# Patient Record
Sex: Female | Born: 1975 | Race: White | Hispanic: No | Marital: Married | Smoking: Former smoker
Health system: Southern US, Community
[De-identification: ages and names within clinical notes are randomized; demographics above are authoritative.]

## PROBLEM LIST (undated history)

## (undated) DIAGNOSIS — F419 Anxiety disorder, unspecified: Secondary | ICD-10-CM

## (undated) DIAGNOSIS — F32A Depression, unspecified: Secondary | ICD-10-CM

## (undated) DIAGNOSIS — B001 Herpesviral vesicular dermatitis: Secondary | ICD-10-CM

## (undated) HISTORY — DX: Anxiety disorder, unspecified: F41.9

## (undated) HISTORY — PX: PLACEMENT OF BREAST IMPLANTS: SHX6334

## (undated) HISTORY — DX: Herpesviral vesicular dermatitis: B00.1

## (undated) HISTORY — DX: Depression, unspecified: F32.A

---

## 2002-09-10 ENCOUNTER — Other Ambulatory Visit: Admission: RE | Admit: 2002-09-10 | Discharge: 2002-09-10 | Payer: Self-pay | Admitting: Obstetrics and Gynecology

## 2003-11-09 ENCOUNTER — Other Ambulatory Visit: Admission: RE | Admit: 2003-11-09 | Discharge: 2003-11-09 | Payer: Self-pay | Admitting: Obstetrics and Gynecology

## 2004-09-15 ENCOUNTER — Other Ambulatory Visit: Admission: RE | Admit: 2004-09-15 | Discharge: 2004-09-15 | Payer: Self-pay | Admitting: Obstetrics and Gynecology

## 2005-01-05 ENCOUNTER — Ambulatory Visit (HOSPITAL_COMMUNITY): Admission: RE | Admit: 2005-01-05 | Discharge: 2005-01-05 | Payer: Self-pay | Admitting: Obstetrics and Gynecology

## 2005-03-26 ENCOUNTER — Inpatient Hospital Stay (HOSPITAL_COMMUNITY): Admission: AD | Admit: 2005-03-26 | Discharge: 2005-03-30 | Payer: Self-pay | Admitting: Obstetrics and Gynecology

## 2005-04-23 ENCOUNTER — Other Ambulatory Visit: Admission: RE | Admit: 2005-04-23 | Discharge: 2005-04-23 | Payer: Self-pay | Admitting: Obstetrics and Gynecology

## 2006-10-11 ENCOUNTER — Ambulatory Visit (HOSPITAL_COMMUNITY): Admission: RE | Admit: 2006-10-11 | Discharge: 2006-10-11 | Payer: Self-pay | Admitting: Obstetrics and Gynecology

## 2007-01-01 ENCOUNTER — Ambulatory Visit (HOSPITAL_COMMUNITY): Admission: RE | Admit: 2007-01-01 | Discharge: 2007-01-01 | Payer: Self-pay | Admitting: Obstetrics and Gynecology

## 2007-03-19 ENCOUNTER — Inpatient Hospital Stay (HOSPITAL_COMMUNITY): Admission: AD | Admit: 2007-03-19 | Discharge: 2007-03-22 | Payer: Self-pay | Admitting: Obstetrics and Gynecology

## 2007-09-30 ENCOUNTER — Ambulatory Visit: Admission: RE | Admit: 2007-09-30 | Discharge: 2007-09-30 | Payer: Self-pay | Admitting: Obstetrics and Gynecology

## 2010-09-08 ENCOUNTER — Emergency Department (HOSPITAL_COMMUNITY)
Admission: EM | Admit: 2010-09-08 | Discharge: 2010-09-08 | Disposition: A | Discharge status: Other Acute Inpatient Hospital | Payer: 59 | Attending: Emergency Medicine | Admitting: Emergency Medicine

## 2010-09-08 ENCOUNTER — Inpatient Hospital Stay (HOSPITAL_COMMUNITY)
Admission: AD | Admit: 2010-09-08 | Discharge: 2010-09-09 | DRG: 885 | Disposition: A | Discharge status: Home / Self Care | Payer: 59 | Source: Ambulatory Visit | Attending: Psychiatry | Admitting: Psychiatry

## 2010-09-08 DIAGNOSIS — F329 Major depressive disorder, single episode, unspecified: Principal | ICD-10-CM

## 2010-09-08 DIAGNOSIS — R45851 Suicidal ideations: Secondary | ICD-10-CM | POA: Insufficient documentation

## 2010-09-08 DIAGNOSIS — F17200 Nicotine dependence, unspecified, uncomplicated: Secondary | ICD-10-CM

## 2010-09-08 DIAGNOSIS — Z79899 Other long term (current) drug therapy: Secondary | ICD-10-CM | POA: Insufficient documentation

## 2010-09-08 DIAGNOSIS — Z818 Family history of other mental and behavioral disorders: Secondary | ICD-10-CM

## 2010-09-08 DIAGNOSIS — F341 Dysthymic disorder: Secondary | ICD-10-CM | POA: Insufficient documentation

## 2010-09-08 LAB — URINALYSIS, ROUTINE W REFLEX MICROSCOPIC
Glucose, UA: NEGATIVE mg/dL
Hgb urine dipstick: NEGATIVE
Nitrite: NEGATIVE
Protein, ur: NEGATIVE mg/dL
Specific Gravity, Urine: 1.018 (ref 1.005–1.030)

## 2010-09-08 LAB — DIFFERENTIAL
Basophils Relative: 1 % (ref 0–1)
Eosinophils Relative: 1 % (ref 0–5)
Lymphocytes Relative: 27 % (ref 12–46)
Lymphs Abs: 1.8 10*3/uL (ref 0.7–4.0)
Monocytes Absolute: 0.5 10*3/uL (ref 0.1–1.0)
Monocytes Relative: 7 % (ref 3–12)
Neutro Abs: 4.2 10*3/uL (ref 1.7–7.7)

## 2010-09-08 LAB — CBC
Hemoglobin: 13.5 g/dL (ref 12.0–15.0)
Platelets: 325 10*3/uL (ref 150–400)
RBC: 5.1 MIL/uL (ref 3.87–5.11)
RDW: 13.4 % (ref 11.5–15.5)
WBC: 6.6 10*3/uL (ref 4.0–10.5)

## 2010-09-08 LAB — ETHANOL: Alcohol, Ethyl (B): <5 mg/dL (ref 0–10)

## 2010-09-08 LAB — POCT I-STAT, CHEM 8
Hemoglobin: 14.6 g/dL (ref 12.0–15.0)
Sodium: 139 mEq/L (ref 135–145)
TCO2: 29 mmol/L (ref 0–100)

## 2010-09-09 DIAGNOSIS — F4320 Adjustment disorder, unspecified: Secondary | ICD-10-CM

## 2010-09-26 NOTE — H&P (Signed)
[name], FINKLE NO.:  [id number]  MEDICAL RECORD NO.:  [id number]           PATIENT TYPE:  I  LOCATION:  0505                          FACILITY:  [facility]  PHYSICIAN:  Eulogio Ditch, MD DATE OF BIRTH:  13-Feb-1976  DATE OF ADMISSION:  09/08/2010 DATE OF DISCHARGE:                      PSYCHIATRIC ADMISSION ASSESSMENT   She presented with her husband to the [facility] emergency department. She stated that she was suicidal, that she had seen her primary care physician, that she had been taking Wellbutrin for depression and Klonopin for anxiety for 2 days, and that the suicidal ideation had started several weeks ago.  Today, she amends that.  She says that she was not actually suicidal she was just hopeless and she just did not think anything would "make her happy."  Apparently she has been having an affair.  Her husband found out about this and this led to her going to her primary care to get any antidepressant.  She does acknowledge anxiety, depression, feeling hopeless, feeling sad, suicidal ideation, suicidal thoughts and being tearful.  She has also recently been using some alcohol.  She denies auditory or visual hallucinations.  PAST PSYCHIATRIC HISTORY:  She says 7 or 8 years ago she had some therapy and some medication back at that time.  She was having relationship issues with the man who is now her husband.  SOCIAL HISTORY:  She received her BA in 1999.  She has been married almost 6 years.  She has 2 sons, ages 76 and 80.  She is a stay-at-home mom.  FAMILY HISTORY:  She states her mother had to be committed for depression.  She does have occasional cigarettes and alcohol.  Her primary care provider is Dr. Gerda Diss.  She has been seeing the therapist, a Dr. Eulah Pont.  MEDICAL PROBLEMS:  She denies.  MEDICATIONS:  She states she was just started on Wellbutrin SA 150 mg per day 2 days ago.  Klonopin 0.5 mg p.o. t.i.d.  DRUG ALLERGIES:  No known  drug allergies.  POSITIVE PHYSICAL FINDINGS:  A well-developed, well-nourished white female who appears her stated age of 37.  She is petite.  Vital signs were stable.  Temperature was 98.2, blood pressure was 91/54, pulse 92, respirations 16.  Her urine pregnancy test was negative.  She had no abnormalities of her CBC or UA.  No drugs in her UDS and alcohol level was less than 5.  MENTAL STATUS EXAM:  She was seen earlier by Dr. Electa Sniff.  She was noted to be alert and oriented, pleasant but sad.  Denies active SI, but acknowledges sincerity of crisis and wants help.  She is overwhelmed. They agree to continue her with the Wellbutrin.  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 Axis II:  Deferred. Axis III:  None known. Axis IV:  Marital and family issues. Axis V:  20.  PLAN:  The plan is to admit for safety and stabilization.  We will continue the Wellbutrin 150 mg p.o. daily as well as the Klonopin 0.5 mg t.i.d., and set up a family session with her husband and consider  IOP. Estimated length of stay is 1-2 days.     Mickie Leonarda Salon, P.A.-C.   ______________________________ Eulogio Ditch, MD    MD/MEDQ  D:  09/09/2010  T:  09/09/2010  Job:  [id number]  Electronically Signed by Jaci Lazier ADAMS P.A.-C. on 09/23/2010 03:32:21 PM Electronically Signed by Eulogio Ditch  on 09/26/2010 05:49:16 PM

## 2010-10-10 NOTE — Op Note (Signed)
[name], [name]              ACCOUNT NO.:  [id number]   MEDICAL RECORD NO.:  [id number]          PATIENT TYPE:  INP   LOCATION:  9102                          [facility]ACILITY:  [facility]   PHYSICIAN:  Juluis Mire, M.D.   DATE OF BIRTH:  06-13-1975   DATE OF PROCEDURE:  03/19/2007  DATE OF DISCHARGE:                               OPERATIVE REPORT   PREOPERATIVE DIAGNOSIS:  Intrauterine pregnancy at 38 weeks.  Spontaneous onset of labor after a fall.  Prior cesarean section desires  repeat.   POSTOPERATIVE DIAGNOSIS:  Intrauterine pregnancy at 38 weeks.  Spontaneous onset of labor after a fall.  Prior cesarean section desires  repeat.   OPERATIVE PROCEDURE:  Repeat low transverse cesarean section.   SURGEON:  Juluis Mire, M.D.   ANESTHESIA:  Spinal.   ESTIMATED BLOOD LOSS:  700 to 800 mL.   PACKS AND DRAINS:  None.   INTRAOPERATIVE BLOOD REPLACED:  None.   COMPLICATIONS:  None.   INDICATIONS:  The patient is a 35 year old gravida 2, para 1 female  prior cesarean section desires repeat is 38 weeks the present time.  Fell on her buttocks at home.  Was subsequently seen in the office sent  to triage for further evaluation.  Ultrasound, blood work and Chief Operating Officer were all negative.  However, she was having regular uterine  activity, fetal heart rate was reactive with no decels but did have  progressive cervical changes.  In view of this, will proceed with repeat  cesarean section.  The risks were discussed, including the risk of  infection.  Risk of hemorrhage could require transfusion with risk of  AIDS or hepatitis.  Risk of injury to adjacent organs including bladder,  bowel, ureters could require further exploratory surgery.  Risk of deep  venous thrombosis and pulmonary embolus.  Procedure was follows.   The patient taken to OR, placed supine position with left lateral tilt.  After satisfactory level spinal anesthesia obtained, the  abdomen was  prepped out Betadine  and draped sterile field.  Prior low transverse  skin incision was excised and excision was extended through subcutaneous  tissue.  Fascia was entered sharp and incision fascia extended  laterally.  Fascia taken off the muscle superiorly inferiorly.  Rectus  muscles separated midline.  Perineum was entered sharply, incision  perineum extended both superiorly inferiorly.  Low transverse bladder  flap was developed a low transverse uterine incision begun with a knife  and extended laterally using manual traction. Infant presented vertex  presentation, delivered elevation head and fundal pressure.  Infant was  a viable female who weighed 7 pounds 4 ounces, Apgars were 9/9, umbilical  artery pH was 7.32.  There was a nuchal cord x1.  Placenta was delivered  manually, noted be unremarkable.  Uterus was exteriorized for closure.  Uterus, tubes and ovaries were unremarkable.  Uterus closed with running  locked suture 0 chromic using a two-layer closure technique.  We had  good hemostasis.  The uterus was turned abdominal cavity.  We irrigated  the pelvis had good hemostasis and clear urine output.  Muscles and  peritoneum closed running suture of 3-0 Vicryl.  Fascia closed with  running suture of 0 PDS.  Skin was closed with staples Steri-Strips.  Sponges, instrument needle count reported as correct by circulating  nurse x2.  Foley catheter remained clear at time of closure.  The  patient tolerated procedure well returned to recovery room in good  condition.      Juluis Mire, M.D.  Electronically Signed     JSM/MEDQ  D:  03/19/2007  T:  03/20/2007  Job:  [id number]

## 2010-10-13 NOTE — Op Note (Signed)
[name], [name]              ACCOUNT NO.:  [id number]   MEDICAL RECORD NO.:  [id number]          PATIENT TYPE:  INP   LOCATION:  9129                          [facility]ILITY[facility]   PHYSICIAN:  Guy Sandifer. Henderson Cloud, M.D. DATE OF BIRTH:  30-Jun-1975   DATE OF PROCEDURE:  03/27/2005  DATE OF DISCHARGE:                                 OPERATIVE REPORT   PREOPERATIVE DIAGNOSES:  1.  Intrauterine pregnancy at 39-1/7 weeks.  2.  Arrest of cervical dilation.   POSTOPERATIVE DIAGNOSES:  1.  Intrauterine pregnancy at 39-1/7 weeks.  2.  Arrest of cervical dilation.   PROCEDURE:  Low transverse cesarean section.   SURGEON:  Guy Sandifer. Henderson Cloud, M.D.   ANESTHESIA:  Epidural, Angelica Pou, M.D.   ESTIMATED BLOOD LOSS:  1000 mL.   FINDINGS:  A viable female infant, Apgars of 9 and 9 at one and five minutes,  respectively.  Arterial cord pH 7.33, birth weight pending.   INDICATIONS AND CONSENT:  This patient is a 35 year old married white  female, G1, P0, with an EDC of April 02, 2005, with uncomplicated prenatal  care and negative group B beta strep culture.  She presents in labor with  the cervix 2 cm dilated, 90% effaced, -1 station.  Artificial rupture of  membranes is carried out.  The patient progresses to 4 cm dilated, complete  effacement, -1 station.  Internal uterine pressure catheter is then placed.  Pitocin is running.  Epidural is in place.  After several hours of adequate  labor with [place] units in the 210-230 range, the cervix is without  change.  A diagnosis of arrest of cervical dilation is made.  A cesarean  section is recommended.  The potential risks and complications are discussed  preoperatively with the patient and her husband, including but not limited  to infection, bowel, bladder or ureteral damage, bleeding requiring of  transfusion of blood products and possible transfusion reaction, HIV and  hepatitis acquisition, DVT, PE and pneumonia.  All questions are  answered  and consent is signed on the chart.   PROCEDURE:  The patient is taken to the operating room, where she is  identified, epidural anesthetic is augmented to a surgical level, and she is  placed on the operating room table in the dorsal supine position with a 15  degree left lateral wedge.  A Foley catheter is already in place.  She is  prepped and draped in a sterile fashion.  After testing for adequate  epidural anesthesia, skin is entered through a Pfannenstiel incision and  dissection is carried out in layers to the peritoneum.  The peritoneum is  incised and extended superiorly and inferiorly.  The vesicouterine  peritoneum is taken down cephalolaterally, the bladder flap is developed and  the bladder blade is placed.  The uterus is then incised in a low transverse  fashion and the uterine cavity is entered bluntly with a hemostat.  The  uterine incision is then extended cephalolaterally with the fingers.  The  vertex is delivered, nuchal cord x1 is reduced.  Oropharynx and nasopharynx  were suctioned.  The remainder of the baby is delivered.  Good cry and tone  is noted.  The cord is clamped and cut and the baby is handed to the waiting  pediatrics team.  The placenta is manually delivered.  The uterus is clean.  The uterus is closed in two running, locking, imbricating layers of 0  Monocryl suture, which achieves good hemostasis.  Tubes and ovaries are  normal.  Anterior peritoneum is closed in a running fashion with 0 Monocryl  suture, which is also used to reapproximate the pyramidalis muscle in the  midline.  Anterior rectus fascia is closed in a running fashion with 0 PDS  suture and the skin is closed with clips.  All sponge, instrument and needle  counts are correct and the patient is transferred to the recovery room in  stable condition.      Guy Sandifer Henderson Cloud, M.D.  Electronically Signed     JET/MEDQ  D:  03/27/2005  T:  03/27/2005  Job:  [id number]

## 2010-10-13 NOTE — Discharge Summary (Signed)
NAMETRACHELLE, [name]              ACCOUNT NO.:  [id number]   MEDICAL RECORD NO.:  [id number]          PATIENT TYPE:  INP   LOCATION:  9102                          [facility]   PHYSICIAN:  Dineen Kid. Rana Snare, M.D.    DATE OF BIRTH:  22-Jan-1976   DATE OF ADMISSION:  03/19/2007  DATE OF DISCHARGE:  03/22/2007                               DISCHARGE SUMMARY   ADMITTING DIAGNOSIS:  1. Intrauterine pregnancy at 38 weeks estimated gestational age  35. Onset of labor after sustaining a fall  3. Previous cesarean section desires repeat   DISCHARGE DIAGNOSIS:  1. Status post [name] transverse cesarean section  2. A viable female infant.   PROCEDURE:  Repeat [name] transverse cesarean section.   REASON FOR ADMISSION:  Please see written H&P.   HOSPITAL COURSE:  The patient is a 35 year old gravida 2, para 1 that  was admitted to [facility] at 38 weeks estimated  gestational age.  The patient had sustained a fall in which she had  landed on her buttocks and was now admitted to maternity admissions for  observation.  The patient was noted to have some continuous  contractions.  Fetal heart tones were reactive without deceleration.  Ultrasound was performed which revealed placenta intact.  Biophysical  profile 8/8.  Kleihauer Becky was negative.  CBC was normal.  The  patient continued to have contractions with cervical change.  The  patient was now admitted for a repeat cesarean delivery.  The patient  was then transferred to the operating room where spinal anesthesia was  administered without difficulty.  [name] transverse incision was made with  delivery of a viable female infant weighing 7 pounds 4 ounces, Apgars of 9  at 1 minute and 9 at 5 minutes.  The patient tolerated procedure well  and taken to the recovery room in stable condition.  On postoperative  day #1 the patient was without complaint.  Vital signs were stable.  Abdomen soft with good return of bowel function.  Fundus is  firm and  nontender.  Abdominal dressing was noted to have a scant amount of old  drainage noted on the bandage.  Foley had been discontinued and the  patient was voiding well.  Laboratory findings revealed hemoglobin of  10.2, platelet count 175,000, WBC count of 11.4.  The patient was known  to be Rh negative and studies were pending for baby blood type.  On  postoperative day #2 the patient was without complaint.  Vital signs  were stable.  Abdomen soft.  Fundus firm and nontender.  Abdominal  dressing had been removed revealing an incision that is clean, dry and  intact.  Baby was known to be Rh positive and RhoGAM was administered to  the patient.  On postoperative day #3 the patient was without complaint.  Vital signs remained stable.  She was afebrile.  Fundus firm and  nontender.  Incision was clean, dry and intact.  Staples removed and the  patient was later discharged home.   CONDITION ON DISCHARGE:  Stable.   DIET:  Regular as tolerated.  ACTIVITY:  No heavy lifting, no driving x2 weeks, no vaginal entry.   FOLLOW UP:  Patient to follow up in the office in 1-2 weeks for incision  check.  She is to call for temperature greater than 100 degrees,  persistent nausea, vomiting, heavy vaginal bleeding and/or redness or  drainage from incisional site.   DISCHARGE MEDICATIONS:  Darvocet N 100 numbers 30 1 p.o. every 4-6 hours  p.r.n. .  Motrin 600 mg every 6 hours.  Prenatal vitamins one p.o.  daily, Colace 1 p.o. daily p.r.n.      Julio Sicks, N.P.      Dineen Kid Rana Snare, M.D.  Electronically Signed    CC/MEDQ  D:  04/10/2007  T:  04/10/2007  Job:  [id number]

## 2010-10-13 NOTE — Discharge Summary (Signed)
[name], [name]              ACCOUNT NO.:  [id number]   MEDICAL RECORD NO.:  [id number]          PATIENT TYPE:  INP   LOCATION:  9129                          [facility]   PHYSICIAN:  Juluis Mire, M.D.   DATE OF BIRTH:  04/30/1976   DATE OF ADMISSION:  03/26/2005  DATE OF DISCHARGE:  03/30/2005                                 DISCHARGE SUMMARY   ADMITTING DIAGNOSES:  1.  Intrauterine pregnancy at 39-1/7th's weeks' estimated gestational age.  2.  Spontaneous onset of labor.   DISCHARGE DIAGNOSES:  1.  Status post low transverse cesarean section secondary to failure to      progress.  2.  Viable female infant.   PROCEDURE:  Primary low transverse cesarean section.   REASON FOR ADMISSION:  Please see written H&P.   HOSPITAL COURSE:  The patient was a 35 year old, white, married female  primigravida that was admitted to [facility] with  spontaneous onset of labor.  On admission, the patient was noted to be 2-cm  dilated, 90% effaced, vertex at -1 station.  Artificial rupture of membranes  was performed revealing clear fluid.  The patient did progress to 4-cm  dilated, completely effaced at -1 station.  Internal pressure catheter was  placed and Pitocin was administered to augment her labor.  Epidural was  placed for the patient's comfort.  After several hours of adequate labor,  cervix was without change.  Decision was made to proceed with a cesarean  delivery. The patient was then transferred to the operating room where  epidural was dosed to an adequate surgical level.  A low transverse incision  was made with the delivery of a viable female infant weighing 6 pounds 13  ounces, with Apgar's of 9 and 1 minute and 9 at 5 minutes.  Arterial cord pH  of 7.33.  The patient tolerated procedure well and was taken to the recovery  room in stable condition.  On postoperative day #1, the patient was without complaint.  Vital signs  were stable.  She was afebrile.   Abdomen soft.  The fundus firm and  nontender.  Incision was clean, dry, and intact.  Some small ecchymosis was  noted inferior to the incisional site.  Laboratory findings revealed  hemoglobin of 9.8, platelet count 178,000, WBC count of 12.6.  Postoperative day #2, the patient was without complaint.  Vital signs  remained stable.  Abdomen soft with good return of bowel function.  Fundus  is firm and nontender.  Incision was clean, dry, and intact.  Small amount  of ecchymosis continued.  The patient was ambulating well, tolerating a  regular diet without complaints of nausea and vomiting.  Postoperative day #3, the patient was without complaint.  Vital signs were  stable.  Fundus firm and nontender.  Incision was clean, dry, and intact.  Staples removed.  The patient was discharged to home.   CONDITION ON DISCHARGE:  Good.   DIET:  Regular as tolerated.   ACTIVITY:  No heavy lifting, no driving x2 weeks, no vaginal entry.   FOLLOW UP:  Patient to  follow up in the office in 1-2 weeks for incision  check.   She is to call for temperature greater than 100 degrees, persistent nausea  and vomiting, heavy vaginal bleeding, and/or redness or drainage from the  incisional site.   DISCHARGE MEDICATIONS:  1.  Tylox, #30, one p.o. every four to six hours p.r.n.  2.  Motrin 600 mg every six hours p.r.n.  3.  Prenatal vitamins one p.o. daily.  4.  Colace one p.o. daily p.r.n.      Julio Sicks, N.P.      Juluis Mire, M.D.  Electronically Signed    CC/MEDQ  D:  05/11/2005  T:  05/12/2005  Job:  [id number]

## 2011-03-07 LAB — CBC
Hemoglobin: 12.8
MCHC: 34.2
Platelets: 210
RDW: 13.6
RDW: 13.8
WBC: 11.4 — ABNORMAL HIGH

## 2011-03-07 LAB — RH IMMUNE GLOB WKUP(>/=20WKS)(NOT WOMEN'S HOSP): Fetal Screen: NEGATIVE

## 2011-03-07 LAB — KLEIHAUER-BETKE STAIN: Quantitation Fetal Hemoglobin: 0

## 2011-03-07 LAB — RPR: RPR Ser Ql: NONREACTIVE

## 2011-03-12 LAB — RH IMMUNE GLOBULIN WORKUP (NOT WOMEN'S HOSP)
ABO/RH(D): O NEG
Antibody Screen: NEGATIVE

## 2012-08-21 ENCOUNTER — Encounter: Payer: Self-pay | Admitting: Family Medicine

## 2012-08-21 ENCOUNTER — Ambulatory Visit (INDEPENDENT_AMBULATORY_CARE_PROVIDER_SITE_OTHER): Payer: BC Managed Care – PPO | Admitting: Family Medicine

## 2012-08-21 VITALS — BP 114/74 | Temp 98.6°F | Ht 63.0 in | Wt 118.8 lb

## 2012-08-21 DIAGNOSIS — E162 Hypoglycemia, unspecified: Secondary | ICD-10-CM

## 2012-08-21 DIAGNOSIS — F329 Major depressive disorder, single episode, unspecified: Secondary | ICD-10-CM

## 2012-08-21 DIAGNOSIS — J322 Chronic ethmoidal sinusitis: Secondary | ICD-10-CM

## 2012-08-21 DIAGNOSIS — F3289 Other specified depressive episodes: Secondary | ICD-10-CM

## 2012-08-21 DIAGNOSIS — F32A Depression, unspecified: Secondary | ICD-10-CM

## 2012-08-21 DIAGNOSIS — Z0000 Encounter for general adult medical examination without abnormal findings: Secondary | ICD-10-CM

## 2012-08-21 LAB — GLUCOSE, POCT (MANUAL RESULT ENTRY): POC Glucose: 121 mg/dl — AB (ref 70–99)

## 2012-08-21 MED ORDER — CLARITHROMYCIN 500 MG PO TABS: 500.0000 mg | ORAL_TABLET | Freq: Two times a day (BID) | ORAL | Status: AC

## 2012-08-21 NOTE — Progress Notes (Signed)
  Subjective:    Patient ID: [name], female    DOB: April 24, 1976, 37 y.o.   MRN: [id number]  Sinusitis The current episode started more than 1 month ago. The problem has been gradually worsening since onset. There has been no fever. Her pain is at a severity of 3/10. The pain is mild. Associated symptoms include headaches (frontal and max) and a hoarse voice (five days). Past treatments include nothing. The treatment provided mild relief.  Diabetes Hypoglycemia symptoms include headaches (frontal and max). Associated symptoms include fatigue.    Dizziness with eating. Light out   Review of Systems  Constitutional: Positive for fatigue.  HENT: Positive for hoarse voice (five days).   Neurological: Positive for headaches (frontal and max).  All other systems reviewed and are negative.   Results for orders placed in visit on 08/21/12  GLUCOSE, POCT (MANUAL RESULT ENTRY)      Result Value Range   POC Glucose 121 (*) 70 - 99 mg/dl       Objective:   Physical Exam  Alert hydration good. Vitals reviewed. Frontal tenderness evident. TMs normal. Pharynx normal. Neck supple no adenopathy lungs clear. Heart regular in rhythm.      Assessment & Plan:  Impression 1 sinusitis. No evidence of diabetes discussed. Plan as per orders. Symptomatic care discussed.

## 2012-08-28 ENCOUNTER — Telehealth: Payer: Self-pay | Admitting: Family Medicine

## 2012-08-28 NOTE — Telephone Encounter (Signed)
Hycodan 4 ounces 1 teaspoon each bedtime when necessary for cough

## 2012-08-28 NOTE — Telephone Encounter (Signed)
Rx called into [facility] per doctors orders.

## 2012-08-28 NOTE — Telephone Encounter (Signed)
Patient would like a cough suppressant called into [facility] in [place].

## 2012-10-21 ENCOUNTER — Telehealth: Payer: Self-pay | Admitting: Family Medicine

## 2012-10-21 MED ORDER — VALACYCLOVIR HCL 1 G PO TABS: ORAL_TABLET | ORAL | Status: DC

## 2012-10-21 NOTE — Telephone Encounter (Signed)
Patient would like Valtrex called in for her cold sores to [facility] in [place]

## 2012-10-21 NOTE — Telephone Encounter (Signed)
Valtrex 1 g. Two tabs , followed 12 hrs later by two more tabs prn flare fever blister. Numb 12. Ref 3

## 2012-10-21 NOTE — Telephone Encounter (Signed)
RX sent in to [facility]. Via [facility]cribe. TCNA @ 6:20 pm.

## 2013-12-30 ENCOUNTER — Ambulatory Visit (INDEPENDENT_AMBULATORY_CARE_PROVIDER_SITE_OTHER): Payer: 59 | Admitting: Nurse Practitioner

## 2013-12-30 ENCOUNTER — Encounter: Payer: Self-pay | Admitting: Nurse Practitioner

## 2013-12-30 VITALS — BP 110/70 | Temp 98.3°F | Ht 63.0 in | Wt 122.0 lb

## 2013-12-30 DIAGNOSIS — J029 Acute pharyngitis, unspecified: Secondary | ICD-10-CM

## 2013-12-30 LAB — POCT RAPID STREP A (OFFICE): RAPID STREP A SCREEN: NEGATIVE

## 2013-12-31 LAB — STREP A DNA PROBE: GASP: NEGATIVE

## 2014-01-04 ENCOUNTER — Encounter: Payer: Self-pay | Admitting: Nurse Practitioner

## 2014-01-04 NOTE — Progress Notes (Signed)
Subjective:  Presents for complaints of sore throat body aches low-grade fever and ear pain that began 2 days ago. No headache. No cough runny nose or wheezing. No vomiting diarrhea or abdominal pain. No rash. Taking fluids well. Voiding normal limit. Possible blisters noted in the throat.  Objective:   BP 110/70  Temp(Src) 98.3 F (36.8 C)  Ht 5\' 3"  (1.6 m)  Wt 122 lb (55.339 kg)  BMI 21.62 kg/m2 NAD. Alert, oriented. TMs mild clear effusion, no erythema. Pharynx mild erythema, no lesions noted. RST negative. No oral lesions. Neck supple with mild soft anterior adenopathy. Lungs clear. Heart regular rate rhythm. Skin clear.  Assessment: Acute pharyngitis, unspecified pharyngitis type - Plan: Strep A DNA probe, POCT rapid strep A  Plan: Throat culture pending. Reviewed symptomatic care and warning signs. Recheck if symptoms worsen or persist.

## 2014-01-14 ENCOUNTER — Encounter: Payer: Self-pay | Admitting: Nurse Practitioner

## 2014-01-14 ENCOUNTER — Ambulatory Visit (INDEPENDENT_AMBULATORY_CARE_PROVIDER_SITE_OTHER): Payer: 59 | Admitting: Nurse Practitioner

## 2014-01-14 VITALS — BP 110/70 | Ht 63.0 in | Wt 122.0 lb

## 2014-01-14 DIAGNOSIS — F4323 Adjustment disorder with mixed anxiety and depressed mood: Secondary | ICD-10-CM

## 2014-01-14 DIAGNOSIS — F32A Depression, unspecified: Secondary | ICD-10-CM

## 2014-01-14 DIAGNOSIS — F3289 Other specified depressive episodes: Secondary | ICD-10-CM

## 2014-01-14 DIAGNOSIS — F329 Major depressive disorder, single episode, unspecified: Secondary | ICD-10-CM

## 2014-01-14 MED ORDER — ALPRAZOLAM 0.5 MG PO TABS: 0.5000 mg | ORAL_TABLET | Freq: Two times a day (BID) | ORAL | Status: DC | PRN

## 2014-01-14 MED ORDER — BUPROPION HCL ER (XL) 150 MG PO TB24: 150.0000 mg | ORAL_TABLET | Freq: Every day | ORAL | Status: DC

## 2014-01-18 ENCOUNTER — Encounter: Payer: Self-pay | Admitting: Nurse Practitioner

## 2014-01-18 NOTE — Progress Notes (Signed)
Subjective:  Presents to discuss her depression and anxiety. Has been off her medications for about a year and a half. Her mental health provider has retired. Off-and-on energy. Fairly regular exercise when she can. Does for better on the day she's able to work out. 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 Anxiety at times. Emotional lability. Some early morning awakenings. Denies homicidal or since all thoughts or ideation. Has taken bupropion in the past which has helped. Rare use of Xanax.  Objective:   BP 110/70  Ht  (1.6 m)  Wt 122 lb (55.339 kg)  BMI 21.62 kg/m2  LMP 12/28/2013 NAD. Alert, oriented. Calm affect. Lungs clear. Heart regular rate rhythm. Thoughts logical coherent and relevant. Dressed appropriately.  Assessment:  Problem List Items Addressed This Visit     Other   Depression - Primary   Relevant Medications      buPROPion (WELLBUTRIN XL) 24 hr tablet      ALPRAZolam (XANAX) tablet   Adjustment disorder with mixed anxiety and depressed mood      Plan:  Meds ordered this encounter  Medications  . buPROPion (WELLBUTRIN XL) 150 MG 24 hr tablet    Sig: Take 1 tablet (150 mg total) by mouth daily.    Dispense:  30 tablet    Refill:  2    Order Specific Question:  Supervising Provider    Answer:  Merlyn Albert [[id number]]  . ALPRAZolam (XANAX) 0.5 MG tablet    Sig: Take 1 tablet (0.5 mg total) by mouth 2 (two) times daily as needed for anxiety.    Dispense:  30 tablet    Refill:  0    Order Specific Question:  Supervising Provider    Answer:  Merlyn Albert [[id number]]   Restart medications as directed. Call back if any problems. Return in about 3 months (around 04/16/2014). Defers mental health referral at this time. Discussed importance of stress reduction and avoiding social isolation.

## 2014-02-03 ENCOUNTER — Telehealth: Payer: Self-pay | Admitting: Family Medicine

## 2014-02-03 MED ORDER — ALPRAZOLAM 0.5 MG PO TABS: 0.5000 mg | ORAL_TABLET | Freq: Two times a day (BID) | ORAL | Status: DC | PRN

## 2014-02-03 NOTE — Telephone Encounter (Signed)
Left message on voicemail notifying patient medication will be faxed to pharmacy by the end of the day.

## 2014-02-03 NOTE — Telephone Encounter (Signed)
Ok plus two monthly ref 

## 2014-02-03 NOTE — Telephone Encounter (Signed)
Last seen 12/2013

## 2014-02-03 NOTE — Telephone Encounter (Signed)
Patient needs Rx for alprazolam to [facility]. She said she had dropped an Rx off there for this, but they lost it.

## 2014-03-17 ENCOUNTER — Telehealth: Payer: Self-pay | Admitting: Family Medicine

## 2014-03-17 NOTE — Telephone Encounter (Signed)
Pt states she saw Eber JonesCarolyn a couple months ago, she was told if she needed stronger Xanax to call.  States previous strength doesn't seem to help much at all.  Please call pt when done [facility]

## 2014-03-17 NOTE — Telephone Encounter (Signed)
Question: is it working but just not long enough or not working at all. One is a problem with the strength of med the other is dosing.

## 2014-03-25 ENCOUNTER — Other Ambulatory Visit: Payer: Self-pay | Admitting: Nurse Practitioner

## 2014-03-25 MED ORDER — ALPRAZOLAM 0.5 MG PO TABS: 0.5000 mg | ORAL_TABLET | Freq: Two times a day (BID) | ORAL | Status: DC | PRN

## 2014-03-25 NOTE — Telephone Encounter (Signed)
Patient states that it is working, just not long enough.

## 2014-03-25 NOTE — Telephone Encounter (Signed)
LMRC

## 2014-03-25 NOTE — Telephone Encounter (Signed)
Script will be faxed to pharmacy today. Patient was notified.  

## 2014-03-25 NOTE — Telephone Encounter (Signed)
Increased number to 60 per month. Follow up in late November as previously planned to recheck and explore other options.

## 2014-05-01 ENCOUNTER — Other Ambulatory Visit: Payer: Self-pay | Admitting: Nurse Practitioner

## 2014-05-13 ENCOUNTER — Other Ambulatory Visit: Payer: Self-pay | Admitting: Nurse Practitioner

## 2014-05-13 NOTE — Telephone Encounter (Signed)
Ok plus 2 ref 

## 2014-05-13 NOTE — Telephone Encounter (Signed)
Last seen 12/2013

## 2014-05-17 ENCOUNTER — Other Ambulatory Visit: Payer: Self-pay | Admitting: Family Medicine

## 2014-06-16 ENCOUNTER — Other Ambulatory Visit: Payer: Self-pay | Admitting: Family Medicine

## 2014-07-26 ENCOUNTER — Encounter: Payer: Self-pay | Admitting: Family Medicine

## 2014-07-26 ENCOUNTER — Ambulatory Visit (INDEPENDENT_AMBULATORY_CARE_PROVIDER_SITE_OTHER): Payer: 59 | Admitting: Family Medicine

## 2014-07-26 VITALS — Temp 98.6°F | Ht 63.0 in | Wt 120.2 lb

## 2014-07-26 DIAGNOSIS — J310 Chronic rhinitis: Secondary | ICD-10-CM

## 2014-07-26 DIAGNOSIS — J329 Chronic sinusitis, unspecified: Secondary | ICD-10-CM | POA: Diagnosis not present

## 2014-07-26 DIAGNOSIS — G44209 Tension-type headache, unspecified, not intractable: Secondary | ICD-10-CM | POA: Diagnosis not present

## 2014-07-26 MED ORDER — CLARITHROMYCIN 500 MG PO TABS: 500.0000 mg | ORAL_TABLET | Freq: Two times a day (BID) | ORAL | Status: AC

## 2014-07-26 MED ORDER — ETODOLAC 400 MG PO TABS: 400.0000 mg | ORAL_TABLET | Freq: Two times a day (BID) | ORAL | Status: DC

## 2014-07-26 NOTE — Progress Notes (Signed)
   Subjective:    Patient ID: [name], female    DOB: 1975/09/10, 39 y.o.   MRN: [id number]  Sinus Problem This is a new problem. The current episode started in the past 7 days. Associated symptoms include headaches. Treatments tried: tylenol sinus.   Frontal pressure and sinus headache and disconmfort  Max pressure and frontal  No fever or chills   Patient notes some increased stress lately. Had bitemporal headaches for a couple weeks. Now facial headache the last few days. Primarily maxillary sinuses no significant congestion drainage. No nocturnal symptoms   Review of Systems  Neurological: Positive for headaches.   No vomiting no diarrhea no rash    Objective:   Physical Exam  Alert slight malaise HEENT question maxillary tenderness TMs normal pharynx normal neck supple lungs clear heart regular in rhythm.      Assessment & Plan:  Impression sinusitis headaches versus other etiology for headaches somewhat atypical presentation discussed with patient. Plan trial Biaxin 500 twice a day 10 days Lodine when necessary. If tension associated may fade anyway WSL

## 2014-12-02 ENCOUNTER — Other Ambulatory Visit: Payer: Self-pay | Admitting: Family Medicine

## 2014-12-02 NOTE — Telephone Encounter (Signed)
Needs office visit.

## 2015-02-02 ENCOUNTER — Other Ambulatory Visit: Payer: Self-pay | Admitting: *Deleted

## 2015-02-02 ENCOUNTER — Telehealth: Payer: Self-pay | Admitting: Family Medicine

## 2015-02-02 MED ORDER — BUPROPION HCL ER (XL) 150 MG PO TB24: 150.0000 mg | ORAL_TABLET | Freq: Every day | ORAL | Status: DC

## 2015-02-02 MED ORDER — ALPRAZOLAM 0.5 MG PO TABS: ORAL_TABLET | ORAL | Status: DC

## 2015-02-02 NOTE — Telephone Encounter (Signed)
30 d worth ea plus f u visit with carolyn or myself

## 2015-02-02 NOTE — Telephone Encounter (Signed)
ALPRAZolam (XANAX) 0.5 MG tablet buPROPion (WELLBUTRIN XL) 150 MG 24 hr tablet  Pt states she did not understand from [facility] that she needed  An office visit. Called today to try to change pharmacies because she  Thought [facility] was just not wanting to fill her scripts. Advised she needed  An OV, made an appt but pt needs these two scripts for at least 2 weeks please  [facility]

## 2015-02-02 NOTE — Telephone Encounter (Signed)
meds sent to pharm. Pt notified on vm. Pt has appt schedule.

## 2015-02-16 ENCOUNTER — Ambulatory Visit (INDEPENDENT_AMBULATORY_CARE_PROVIDER_SITE_OTHER): Payer: Commercial Managed Care - HMO | Admitting: Nurse Practitioner

## 2015-02-16 ENCOUNTER — Encounter: Payer: Self-pay | Admitting: Nurse Practitioner

## 2015-02-16 VITALS — BP 108/70 | Ht 63.0 in | Wt 122.0 lb

## 2015-02-16 DIAGNOSIS — F418 Other specified anxiety disorders: Secondary | ICD-10-CM

## 2015-02-16 MED ORDER — BUPROPION HCL ER (XL) 300 MG PO TB24: 300.0000 mg | ORAL_TABLET | Freq: Every day | ORAL | Status: DC

## 2015-02-16 MED ORDER — ALPRAZOLAM 0.5 MG PO TABS: ORAL_TABLET | ORAL | Status: DC

## 2015-02-17 ENCOUNTER — Encounter: Payer: Self-pay | Admitting: Nurse Practitioner

## 2015-02-17 DIAGNOSIS — F418 Other specified anxiety disorders: Secondary | ICD-10-CM | POA: Insufficient documentation

## 2015-02-17 NOTE — Progress Notes (Signed)
Subjective:  Presents for routine follow-up. Patient had to be discrete in her comments today, has both of her sons with her. Doing much better on Wellbutrin XL but most days still has some depression symptoms. Would like to increase dose. Denies any adverse effects. Continues to have some sleep issues. As far as pregnancy, her husband has had a vasectomy.  Objective:   BP 108/70 mmHg  Ht  (1.6 m)  Wt 122 lb (55.339 kg)  BMI 21.62 kg/m2 NAD. Alert, oriented. Calm affect. Lungs clear. Heart regular rate rhythm.  Assessment:  Problem List Items Addressed This Visit      Other   Depression with anxiety - Primary     Plan:  Meds ordered this encounter  Medications  . buPROPion (WELLBUTRIN XL) 300 MG 24 hr tablet    Sig: Take 1 tablet (300 mg total) by mouth daily.    Dispense:  30 tablet    Refill:  2    Order Specific Question:  Supervising Provider    Answer:  Merlyn Albert [[id number]]  . ALPRAZolam (XANAX) 0.5 MG tablet    Sig: take 1 tablet by mouth twice a day if needed    Dispense:  60 tablet    Refill:  2    May refill monthly    Order Specific Question:  Supervising Provider    Answer:  Merlyn Albert [[id number]]  . DISCONTD: buPROPion (WELLBUTRIN XL) 150 MG 24 hr tablet    Sig:    Increase Wellbutrin dose to 300 mg. Call if any adverse effects. Patient had questions about increasing the strength of her Xanax dose. Explained that we would like to start a daily medicine to control most of her symptoms, Xanax is intended as a when necessary medication for breakthrough symptoms. Return in about 1 month (around 03/18/2015). Call back sooner if any problems.

## 2015-03-16 ENCOUNTER — Ambulatory Visit: Payer: Commercial Managed Care - HMO | Admitting: Nurse Practitioner

## 2015-03-28 ENCOUNTER — Emergency Department (HOSPITAL_COMMUNITY)
Admission: EM | Admit: 2015-03-28 | Discharge: 2015-03-28 | Disposition: A | Discharge status: Home / Self Care | Payer: Commercial Managed Care - HMO | Attending: Emergency Medicine | Admitting: Emergency Medicine

## 2015-03-28 ENCOUNTER — Emergency Department (HOSPITAL_COMMUNITY): Payer: Commercial Managed Care - HMO

## 2015-03-28 ENCOUNTER — Encounter (HOSPITAL_COMMUNITY): Payer: Self-pay

## 2015-03-28 DIAGNOSIS — Z880 Allergy status to penicillin: Secondary | ICD-10-CM | POA: Diagnosis not present

## 2015-03-28 DIAGNOSIS — Y998 Other external cause status: Secondary | ICD-10-CM | POA: Diagnosis not present

## 2015-03-28 DIAGNOSIS — Y9289 Other specified places as the place of occurrence of the external cause: Secondary | ICD-10-CM | POA: Diagnosis not present

## 2015-03-28 DIAGNOSIS — R569 Unspecified convulsions: Secondary | ICD-10-CM

## 2015-03-28 DIAGNOSIS — Z3202 Encounter for pregnancy test, result negative: Secondary | ICD-10-CM | POA: Insufficient documentation

## 2015-03-28 DIAGNOSIS — S0990XA Unspecified injury of head, initial encounter: Secondary | ICD-10-CM | POA: Insufficient documentation

## 2015-03-28 DIAGNOSIS — Z79899 Other long term (current) drug therapy: Secondary | ICD-10-CM | POA: Diagnosis not present

## 2015-03-28 DIAGNOSIS — W1839XA Other fall on same level, initial encounter: Secondary | ICD-10-CM | POA: Diagnosis not present

## 2015-03-28 DIAGNOSIS — Y9389 Activity, other specified: Secondary | ICD-10-CM | POA: Diagnosis not present

## 2015-03-28 LAB — BASIC METABOLIC PANEL
ANION GAP: 12 (ref 5–15)
BUN: 18 mg/dL (ref 6–20)
CHLORIDE: 104 mmol/L (ref 101–111)
CO2: 20 mmol/L — AB (ref 22–32)
CREATININE: 0.81 mg/dL (ref 0.44–1.00)
Calcium: 9.2 mg/dL (ref 8.9–10.3)
GFR calc non Af Amer: >60 mL/min (ref >60)
Glucose, Bld: 142 mg/dL — ABNORMAL HIGH (ref 65–99)
Potassium: 4 mmol/L (ref 3.5–5.1)
Sodium: 136 mmol/L (ref 135–145)

## 2015-03-28 LAB — CBC
HEMATOCRIT: 36.5 % (ref 36.0–46.0)
HEMOGLOBIN: 11.9 g/dL — AB (ref 12.0–15.0)
MCH: 26.3 pg (ref 26.0–34.0)
MCHC: 32.6 g/dL (ref 30.0–36.0)
MCV: 80.6 fL (ref 78.0–100.0)
PLATELETS: 276 10*3/uL (ref 150–400)
RBC: 4.53 MIL/uL (ref 3.87–5.11)
RDW: 13.5 % (ref 11.5–15.5)
WBC: 8 10*3/uL (ref 4.0–10.5)

## 2015-03-28 LAB — I-STAT BETA HCG BLOOD, ED (MC, WL, AP ONLY): I-stat hCG, quantitative: <5 m[IU]/mL (ref <5)

## 2015-03-28 LAB — CBG MONITORING, ED: Glucose-Capillary: 119 mg/dL — ABNORMAL HIGH (ref 65–99)

## 2015-03-28 MED ORDER — ONDANSETRON HCL 4 MG/2ML IJ SOLN
4.0000 mg | Freq: Once | INTRAMUSCULAR | Status: DC
  Filled 2015-03-28: qty 2

## 2015-03-28 MED ORDER — DIPHENHYDRAMINE HCL 50 MG/ML IJ SOLN
25.0000 mg | Freq: Once | INTRAMUSCULAR | Status: AC
  Administered 2015-03-28: 25 mg via INTRAVENOUS
  Filled 2015-03-28: qty 1

## 2015-03-28 MED ORDER — ONDANSETRON HCL 4 MG/2ML IJ SOLN
4.0000 mg | Freq: Once | INTRAMUSCULAR | Status: AC
  Administered 2015-03-28: 4 mg via INTRAVENOUS

## 2015-03-28 MED ORDER — PROCHLORPERAZINE EDISYLATE 5 MG/ML IJ SOLN
10.0000 mg | Freq: Once | INTRAMUSCULAR | Status: AC
  Administered 2015-03-28: 10 mg via INTRAVENOUS
  Filled 2015-03-28: qty 2

## 2015-03-28 MED ORDER — LEVETIRACETAM IN NACL 1000 MG/100ML IV SOLN
1000.0000 mg | Freq: Once | INTRAVENOUS | Status: AC
  Administered 2015-03-28: 1000 mg via INTRAVENOUS
  Filled 2015-03-28: qty 100

## 2015-03-28 NOTE — ED Notes (Signed)
Discharge instructions given to pt and husband - Verbalized understanding , husband stated that he would be calling the neurologist in am .Pt wheeled off unit .

## 2015-03-28 NOTE — ED Notes (Signed)
Per EMS, pt had a witnessed seizure. No known history. States patient vomited once. Pt states she just got sick.

## 2015-03-28 NOTE — ED Provider Notes (Signed)
CSN: [id number]     Arrival date & time 03/28/15  1733 History   First MD Initiated Contact with Patient 03/28/15 1830     Chief Complaint  Patient presents with  . Seizures     (Consider location/radiation/quality/duration/timing/severity/associated sxs/prior Treatment) HPI Comments: 39 year old female who presents with seizure. History obtained with the assistance of the patient's husband. Patient states that she has had 1 month of intermittent headaches that are frequent but not always daily. She has been taking ibuprofen occasionally for the pain. This evening, the patient stepped outside and her mom states that she grabbed her head, screamed, and fell down in a generalized tonic-clonic seizure. She was unresponsive with rigid extremities. This seizure lasted approximately 45 seconds to 1 minute after which it spontaneously stopped. She had a 5 minute period of confusion after which she returned to neurologic baseline. She currently endorses a bitemporal headache but denies any visual changes, extremity numbness/weakness, balance problems, neck stiffness, fevers, or recent illness. She had one episode of vomiting after the seizure but has not had any vomiting recently. No rash, tick bites, or recent travel.  Patient is a 39 y.o. female presenting with seizures. The history is provided by the patient and the spouse.  Seizures   History reviewed. No pertinent past medical history. Past Surgical History  Procedure Laterality Date  . Cesarean section      2   Family History  Problem Relation Age of Onset  . Diabetes Father    Social History  Substance Use Topics  . Smoking status: Former Games developermoker  . Smokeless tobacco: None  . Alcohol Use: No   OB History    No data available     Review of Systems  Neurological: Positive for seizures.    10 Systems reviewed and are negative for acute change except as noted in the HPI.   Allergies  Penicillins  Home Medications   Prior to  Admission medications   Medication Sig Start Date End Date Taking? Authorizing Provider  ALPRAZolam Prudy Feeler(XANAX) 0.5 MG tablet take 1 tablet by mouth twice a day if needed Patient taking differently: Take 0.5 mg by mouth 2 (two) times daily as needed for anxiety. take 1 tablet by mouth twice a day if needed 02/16/15  Yes Campbell Richesarolyn C Hoskins, NP  buPROPion (WELLBUTRIN XL) 300 MG 24 hr tablet Take 1 tablet (300 mg total) by mouth daily. 02/16/15  Yes Campbell Richesarolyn C Hoskins, NP  oxymetazoline (NASAL SPRAY 12 HOUR) 0.05 % nasal spray Place 1 spray into both nostrils 2 (two) times daily as needed for congestion.   Yes Historical Provider, MD  valACYclovir (VALTREX) 1000 MG tablet TAKE TWO TABLETS BY MOUTH FOLLOWED 12 HOURS LATER BY TWO MORE TABLETS AS NEEDED FOR FEVER BLISTERS 05/17/14  Yes Campbell Richesarolyn C Hoskins, NP   BP 125/70 mmHg  Pulse 88  Temp(Src) 97.8 F (36.6 C) (Oral)  Resp 24  SpO2 100%  LMP 03/12/2015 Physical Exam  Constitutional: She is oriented to person, place, and time. She appears well-developed and well-nourished. No distress.  Awake, alert, towel across forehead  HENT:  Head: Normocephalic and atraumatic.  Eyes: Conjunctivae and EOM are normal. Pupils are equal, round, and reactive to light.  Neck: Neck supple.  Cardiovascular: Normal rate, regular rhythm and normal heart sounds.   No murmur heard. Pulmonary/Chest: Effort normal and breath sounds normal. No respiratory distress.  Abdominal: Soft. Bowel sounds are normal. She exhibits no distension.  Musculoskeletal: She exhibits no edema.  Neurological: She  is alert and oriented to person, place, and time. She has normal reflexes. No cranial nerve deficit. She exhibits normal muscle tone.  Fluent speech, normal finger-to-nose testing, negative pronator drift  Skin: Skin is warm and dry.  Psychiatric: She has a normal mood and affect. Judgment and thought content normal.  Nursing note and vitals reviewed.   ED Course  Procedures  (including critical care time) Labs Review Labs Reviewed  BASIC METABOLIC PANEL - Abnormal; Notable for the following:    CO2 20 (*)    Glucose, Bld 142 (*)    All other components within normal limits  CBC - Abnormal; Notable for the following:    Hemoglobin 11.9 (*)    All other components within normal limits  CBG MONITORING, ED - Abnormal; Notable for the following:    Glucose-Capillary 119 (*)    All other components within normal limits  I-STAT BETA HCG BLOOD, ED ([facility], [facility], [facility] ONLY)    Imaging Review Ct Head Wo Contrast  03/28/2015  CLINICAL DATA:  Initial encounter for witnessed seizure earlier today. EXAM: CT HEAD WITHOUT CONTRAST TECHNIQUE: Contiguous axial images were obtained from the base of the skull through the vertex without intravenous contrast. COMPARISON:  None. FINDINGS: There is no evidence for acute hemorrhage, hydrocephalus, mass lesion, or abnormal extra-axial fluid collection. No definite CT evidence for acute infarction. The visualized paranasal sinuses and mastoid air cells are clear. IMPRESSION: Normal exam. Electronically Signed   By: Kennith Center M.D.   On: 03/28/2015 19:20   I have personally reviewed and evaluated these lab results as part of my medical decision-making.   EKG Interpretation   Date/Time:  Monday March 28 2015 17:37:16 EDT Ventricular Rate:  102 PR Interval:  143 QRS Duration: 77 QT Interval:  353 QTC Calculation: 460 R Axis:   74 Text Interpretation:  Sinus tachycardia Nonspecific repol abnormality,  diffuse leads No previous ECGs available borderline ST depression  diffusely Confirmed by Mirenda Baltazar MD, Aletha Allebach [phone]) on 03/28/2015 6:39:49 PM     Medications  ondansetron (ZOFRAN) injection 4 mg (4 mg Intravenous Given 03/28/15 1819)  diphenhydrAMINE (BENADRYL) injection 25 mg (25 mg Intravenous Given 03/28/15 1932)  prochlorperazine (COMPAZINE) injection 10 mg (10 mg Intravenous Given 03/28/15 1933)  levETIRAcetam (KEPPRA) IVPB  1000 mg/100 mL premix (1,000 mg Intravenous Given 03/28/15 1947)    MDM   Final diagnoses:  Seizure (HCC)    39 year old female who presents with brief generalized seizure that was witnessed by family earlier this evening. Patient brought in by EMS and was awake, alert, oriented 3 with normal vital signs. Normal neurologic exam. Patient did endorse headache. Obtained above labs as well as EKG which was unremarkable. The patient denied any family history of early cardiac disease and any chest pain/shortness of breath symptoms. Because of first time seizure activity and headaches, obtained a CT of the head which was unremarkable. Gave the patient Benadryl, Compazine, Zofran, and IV keppra.   Labs were notable for CO2 20, Glucose 119, otherwise unremarkable. I spoke with the neurologist on-call, Dr. Hosie Poisson, and appreciate his assistance. He has recommended no current antiepileptic medication and close follow-up in the neurology clinic. I have relayed this information to the patient and her husband. I have extensively reviewed precautions including no driving, swimming, or climbing ladders until follow-up. Return precautions reviewed. Provided with outpatient clinic information. Patient voiced understanding was discharged in satisfactory condition.   Laurence Spates, MD 03/28/15 [phone]

## 2015-03-28 NOTE — Discharge Instructions (Signed)

## 2015-03-29 ENCOUNTER — Other Ambulatory Visit: Payer: Self-pay | Admitting: Neurology

## 2015-03-29 DIAGNOSIS — R569 Unspecified convulsions: Secondary | ICD-10-CM

## 2015-04-04 ENCOUNTER — Ambulatory Visit (INDEPENDENT_AMBULATORY_CARE_PROVIDER_SITE_OTHER): Payer: Commercial Managed Care - HMO | Admitting: Family Medicine

## 2015-04-04 ENCOUNTER — Encounter: Payer: Self-pay | Admitting: Family Medicine

## 2015-04-04 VITALS — BP 110/78 | Ht 63.0 in | Wt 122.4 lb

## 2015-04-04 DIAGNOSIS — G40909 Epilepsy, unspecified, not intractable, without status epilepticus: Secondary | ICD-10-CM | POA: Diagnosis not present

## 2015-04-04 DIAGNOSIS — F32A Depression, unspecified: Secondary | ICD-10-CM

## 2015-04-04 DIAGNOSIS — F329 Major depressive disorder, single episode, unspecified: Secondary | ICD-10-CM | POA: Diagnosis not present

## 2015-04-04 MED ORDER — ESCITALOPRAM OXALATE 10 MG PO TABS: 10.0000 mg | ORAL_TABLET | Freq: Every day | ORAL | Status: DC

## 2015-04-04 NOTE — Progress Notes (Signed)
   Subjective:    Patient ID: [name], female    DOB: 1976/05/06, 39 y.o.   MRN: [id number] Patient presents for an extremely protracted visit concerning several complicated issues which are somewhat related next her Seizures  This is a new problem. The current episode started more than 1 week ago. There was 1 seizure.   Patient in today for ER follow up from 03/28/15. Patient was seen at the ER for Seizures. Patient experienced a generalized seizure. This was witnessed by family member patient's right out grabbed her head. Felt to 4 had tonic-clonic motion. Husband who is a fireman witnessed this. Had a number of minutes of presume in post ictal drowning drowsiness and out of it. Next  Was seen in emergency room workup done CT negative. Blood work negative.  Patient has been on Wellbutrin for some time for depression. Was recently increased due to challenges with both depression anxiety. Next  Patient notes history of headaches this year often tension like and feature bilateral bitemporal some radiation towards neck almost every day. Has been experiencing some nighttime headaches also.  Patient admits to chronic anxiety. This is worsened of late. Takes a couple Xanax as per day  Has been on other antidepressants in the past.  Saw neurologist elected not to put the patient on seizure medication. They also went ahead and ordered an MRI MRA this is due for next week. Patient to follow-up with neurologist a couple weeks after that.  Was advised to get on another antidepressant by the neurologist. In fact they cut her dose back to the 150 mg Wellbutrin that she was on before  Takes one Advil when necessary for headaches.  Bitemporal headache s steady and daily  Was not extra stress ed the day of the seizure  Took  Ibuprofen, took otc pseudofed prn  Does not recall immed aftermath after the seizure.  Not wrking outside of home    States no other concerns this visit.   Review  of Systems  Neurological: Positive for seizures.   No chest pain no abdominal pain no change in bowel habits no rash complete ROS otherwise negative    Objective:   Physical Exam  Alert vital stable blood pressure good HEENT normal neuro exam intact neck supple lungs clear heart regular in rhythm fine motor control intact ankles without edema Patient anxious appearing oriented 3 slightly depressed affect somewhat tearful     Assessment & Plan:  Impression seizure disorder new onset. Very very long discussion held in this regard. There are unknowns now. Patient has had an EEG results are not back yet do an MRI MRA which I think is appropriate. Not started on seizure medicine at this time. The seizure may well be related to the patient's Wellbutrin. Discussed how this can lower seizure threshold and rarely cause seizures. I think Wellbutrin was in excellent choice for her, but no longer. Discussed at length multiple options also discussed plan 45 minutes spent most in discussion in regards this complex challenge. Press on wean from Wellbutrin. Initiate Lexapro after stopping. Follow-up here in one month. If neurologist will not release scan results within a day call us and we will work on getting it for the patient. Encouraged to use ibuprofen for headache 400 mg at least WSL

## 2015-04-05 DIAGNOSIS — G40909 Epilepsy, unspecified, not intractable, without status epilepticus: Secondary | ICD-10-CM | POA: Insufficient documentation

## 2015-04-11 ENCOUNTER — Ambulatory Visit
Admission: RE | Admit: 2015-04-11 | Discharge: 2015-04-11 | Disposition: A | Discharge status: Home / Self Care | Payer: Commercial Managed Care - HMO | Source: Ambulatory Visit | Attending: Neurology | Admitting: Neurology

## 2015-04-11 DIAGNOSIS — R569 Unspecified convulsions: Secondary | ICD-10-CM

## 2015-04-11 MED ORDER — GADOBENATE DIMEGLUMINE 529 MG/ML IV SOLN
10.0000 mL | Freq: Once | INTRAVENOUS | Status: AC | PRN
  Administered 2015-04-11: 10 mL via INTRAVENOUS

## 2015-04-13 ENCOUNTER — Telehealth: Payer: Self-pay | Admitting: Family Medicine

## 2015-04-13 NOTE — Telephone Encounter (Signed)
MRI is normal , good news. of course still need to f u with dr Peggye Formdoomquah

## 2015-04-13 NOTE — Telephone Encounter (Signed)
Ordered by dr Gerilyn Pilgrimdoonquah

## 2015-04-13 NOTE — Telephone Encounter (Signed)
Pt was told by Dr Brett CanalesSteve that if her MRI results had not been  Given to her by today to call an let us know this so we can  Try to get the results sooner

## 2015-04-14 ENCOUNTER — Other Ambulatory Visit (HOSPITAL_COMMUNITY): Payer: 59

## 2015-04-14 NOTE — Telephone Encounter (Signed)
Discussed with pt

## 2015-04-15 ENCOUNTER — Other Ambulatory Visit (HOSPITAL_COMMUNITY): Payer: 59

## 2015-05-06 ENCOUNTER — Encounter: Payer: Self-pay | Admitting: Family Medicine

## 2015-05-06 ENCOUNTER — Ambulatory Visit (INDEPENDENT_AMBULATORY_CARE_PROVIDER_SITE_OTHER): Payer: Commercial Managed Care - HMO | Admitting: Family Medicine

## 2015-05-06 VITALS — BP 120/78 | Ht 63.0 in | Wt 122.0 lb

## 2015-05-06 DIAGNOSIS — G44209 Tension-type headache, unspecified, not intractable: Secondary | ICD-10-CM | POA: Diagnosis not present

## 2015-05-06 DIAGNOSIS — F418 Other specified anxiety disorders: Secondary | ICD-10-CM | POA: Diagnosis not present

## 2015-05-06 MED ORDER — ESCITALOPRAM OXALATE 10 MG PO TABS: 10.0000 mg | ORAL_TABLET | Freq: Every day | ORAL | Status: DC

## 2015-05-06 NOTE — Progress Notes (Signed)
   Subjective:    Patient ID: Orion CrookHollie S Arya, female    DOB: 07/28/1975, 39 y.o.   MRN: [id number]  HPI Patient arrives to follow up on depression. Patient was switched to lexapro at last visit due to new onset seizures. Patient states she is doing well on new meds with no problems.  Exercise back to normal  Normal eeg, ovdrall better  qthree   No seasonal dep. depr substantial  No further seizures thankfully. Compliant with medications. No obvious side effects. Notes mood has improved. Energy is improved Review of Systems No headache no chest pain and back pain    Objective:   Physical Exam Alert vitals stable HEENT normal. Lungs clear heart regular in rhythm neuro exam intact       Assessment & Plan:  Impression 1 depression improved #2 headaches improved #3 seizure disorder stable plan maintain same meds long-term nature of treatment discuss in encourage recheck in 6 months exercise to maintain WSL

## 2015-05-29 DIAGNOSIS — R569 Unspecified convulsions: Secondary | ICD-10-CM

## 2015-05-29 HISTORY — DX: Unspecified convulsions: R56.9

## 2015-09-05 ENCOUNTER — Other Ambulatory Visit: Payer: Self-pay | Admitting: Family Medicine

## 2015-09-05 NOTE — Telephone Encounter (Signed)
Ok three months worth

## 2015-09-24 ENCOUNTER — Emergency Department (HOSPITAL_COMMUNITY)
Admission: EM | Admit: 2015-09-24 | Discharge: 2015-09-24 | Disposition: A | Discharge status: Home / Self Care | Payer: Commercial Managed Care - HMO | Attending: Emergency Medicine | Admitting: Emergency Medicine

## 2015-09-24 ENCOUNTER — Emergency Department (HOSPITAL_COMMUNITY): Payer: Commercial Managed Care - HMO

## 2015-09-24 ENCOUNTER — Encounter (HOSPITAL_COMMUNITY): Payer: Self-pay | Admitting: Emergency Medicine

## 2015-09-24 DIAGNOSIS — Y939 Activity, unspecified: Secondary | ICD-10-CM | POA: Diagnosis not present

## 2015-09-24 DIAGNOSIS — Z79899 Other long term (current) drug therapy: Secondary | ICD-10-CM | POA: Insufficient documentation

## 2015-09-24 DIAGNOSIS — Y929 Unspecified place or not applicable: Secondary | ICD-10-CM | POA: Diagnosis not present

## 2015-09-24 DIAGNOSIS — X58XXXA Exposure to other specified factors, initial encounter: Secondary | ICD-10-CM | POA: Insufficient documentation

## 2015-09-24 DIAGNOSIS — S6991XA Unspecified injury of right wrist, hand and finger(s), initial encounter: Secondary | ICD-10-CM | POA: Diagnosis present

## 2015-09-24 DIAGNOSIS — S63614A Unspecified sprain of right ring finger, initial encounter: Secondary | ICD-10-CM | POA: Diagnosis not present

## 2015-09-24 DIAGNOSIS — Y999 Unspecified external cause status: Secondary | ICD-10-CM | POA: Insufficient documentation

## 2015-09-24 MED ORDER — DICLOFENAC SODIUM 50 MG PO TBEC: 50.0000 mg | DELAYED_RELEASE_TABLET | Freq: Two times a day (BID) | ORAL | Status: DC

## 2015-09-24 NOTE — ED Notes (Signed)
Patient c/o right ring finger pain. Per patient her and sons hands collided. Patient unable to bend finger.

## 2015-09-24 NOTE — ED Provider Notes (Signed)
CSN: [id number]     Arrival date & time 09/24/15  1716 History   First MD Initiated Contact with Patient 09/24/15 1724     Chief Complaint  Patient presents with  . Hand Pain     (Consider location/radiation/quality/duration/timing/severity/associated sxs/prior Treatment) Patient is a 40 y.o. female presenting with hand injury. The history is provided by the patient.  Hand Injury Location:  Finger Time since incident:  2 days Injury: yes   Finger location:  R ring finger Pain details:    Quality:  Throbbing   Radiates to:  Does not radiate   Severity:  Moderate   Onset quality:  Sudden   Timing:  Constant   Progression:  Unchanged Chronicity:  New Handedness:  Right-handed Dislocation: no   Foreign body present:  No foreign bodies Prior injury to area:  No Relieved by:  Nothing Worsened by:  Movement Ineffective treatments:  NSAIDs Associated symptoms: swelling    [name] is a 40 y.o. female who presents to the ED with right ring finger pain. She reports that her hand and her son's hand collided. She has been unable to move her finger since then. She denies any other injuries.   History reviewed. No pertinent past medical history. Past Surgical History  Procedure Laterality Date  . Cesarean section      2   Family History  Problem Relation Age of Onset  . Diabetes Father    Social History  Substance Use Topics  . Smoking status: Never Smoker   . Smokeless tobacco: Never Used  . Alcohol Use: No   OB History    Gravida Para Term Preterm AB TAB SAB Ectopic Multiple Living   2 2 2       2      Review of Systems  Negative except as stated in HPI  Allergies  Penicillins  Home Medications   Prior to Admission medications   Medication Sig Start Date End Date Taking? Authorizing Provider  ALPRAZolam Prudy Feeler(XANAX) 0.5 MG tablet take 1 tablet by mouth twice a day if needed Patient taking differently: take 1 tablet by mouth twice a day if needed for anxiety  09/05/15  Yes Merlyn AlbertWilliam S Luking, MD  escitalopram (LEXAPRO) 10 MG tablet Take 1 tablet (10 mg total) by mouth daily. 05/06/15 05/05/16 Yes Merlyn AlbertWilliam S Luking, MD  oxymetazoline (NASAL SPRAY 12 HOUR) 0.05 % nasal spray Place 1 spray into both nostrils 2 (two) times daily as needed for congestion.   Yes Historical Provider, MD  diclofenac (VOLTAREN) 50 MG EC tablet Take 1 tablet (50 mg total) by mouth 2 (two) times daily. 09/24/15   Hope Orlene OchM Neese, NP   BP 112/73 mmHg  Pulse 70  Temp(Src) 98.3 F (36.8 C) (Oral)  Resp 18  Ht 5\' 2"  (1.575 m)  Wt 53.978 kg  BMI 21.76 kg/m2  SpO2 100%  LMP 09/17/2015 Physical Exam  Constitutional: She is oriented to person, place, and time. She appears well-developed and well-nourished. No distress.  HENT:  Head: Normocephalic and atraumatic.  Eyes: EOM are normal.  Neck: Normal range of motion. Neck supple.  Cardiovascular: Normal rate.   Pulmonary/Chest: Effort normal.  Musculoskeletal:       Right hand: She exhibits tenderness and swelling. She exhibits normal capillary refill and no laceration. Decreased range of motion: due to swelling and pain. Normal sensation noted. Normal strength noted. She exhibits no thumb/finger opposition.       Hands: Radial pulse 2+, adequate circulation. Right ring  finger with swelling, ecchymosis and tenderness.   Neurological: She is alert and oriented to person, place, and time. No cranial nerve deficit.  Skin: Skin is warm and dry.  Psychiatric: She has a normal mood and affect. Her behavior is normal.  Nursing note and vitals reviewed.   ED Course  Procedures (including critical care time) Labs Review Labs Reviewed - No data to display  Imaging Review Dg Hand Complete Right  09/24/2015  CLINICAL DATA:  Right hand injury, pain, swelling. EXAM: RIGHT HAND - COMPLETE 3+ VIEW COMPARISON:  None. FINDINGS: There is no evidence of fracture or dislocation. There is no evidence of arthropathy or other focal bone abnormality.  Soft tissues are unremarkable. IMPRESSION: Negative. Electronically Signed   By: Bary Richard M.D.   On: 09/24/2015 17:44   I have personally reviewed and evaluated these images as part of my medical decision-making.   MDM  40 y.o. female with swelling, bruising and pain to the right ring finger s/p injury 2 days ago stable for d/c without fracture or dislocation noted on x-ray and no focal neuro deficits. Splint applied, ice, elevation, voltaren and f/u with hand surgeon if symptoms persist.   Final diagnoses:  Sprain of right ring finger, initial encounter       [facility], NP 09/24/15 1821  Raeford Razor, MD 09/28/15 1347

## 2015-11-17 ENCOUNTER — Encounter: Payer: Self-pay | Admitting: Family Medicine

## 2015-11-17 ENCOUNTER — Ambulatory Visit (INDEPENDENT_AMBULATORY_CARE_PROVIDER_SITE_OTHER): Payer: Commercial Managed Care - HMO | Admitting: Family Medicine

## 2015-11-17 VITALS — BP 112/58 | Temp 98.8°F | Ht 63.0 in | Wt 126.0 lb

## 2015-11-17 DIAGNOSIS — R208 Other disturbances of skin sensation: Secondary | ICD-10-CM

## 2015-11-17 MED ORDER — VALACYCLOVIR HCL 1 G PO TABS: 1000.0000 mg | ORAL_TABLET | Freq: Three times a day (TID) | ORAL | Status: DC

## 2015-11-17 NOTE — Progress Notes (Signed)
   Subjective:    Patient ID: [name], female    DOB: Dec 05, 1975, 40 y.o.   MRN: [id number]  HPIskin in painful to touch on low back and abdomen. Started 3 weeks ago. Getting worse.    Patient relates burning in the skin she describes it in the mid back region radiates around to the abdomen over the past couple days but now today is just in the back the skin hurts to the touch. She describes it as a burning and sometimes as a deep ache she states that she is not seen any rash or blisters with this. In addition to this patient also had episode a couple weeks ago where she had burning and pain down her right leg from her lower back while she was on a cruise. Patient does a fair amount of physical activity including cardio classes and lifting weights she is never having problems this before. She has had chickenpox in the past  Review of Systems See above. No blisters no rash    Objective:   Physical Exam On physical exam she has subjective discomfort in the low back subjective discomfort across into the abdomen region. Negative straight leg raise. Reflexes are good. Strengthen legs and feet good.      Assessment & Plan:  #1 sciatica seems to be resolving if worsening issues follow-up  Skin discomfort-this is a possibility of shingles. We will go ahead and cover this with Valtrex 1 g 3 times daily for the next 7 days. I highly recommend patient to follow-up within 2 weeks. If ongoing troubles consider the possibility of a nerve impingement may need further testing. Follow-up if ongoing troubles follow-up in 2 weeks as directed if rashes or other problems occur notify us

## 2015-12-01 ENCOUNTER — Ambulatory Visit: Payer: Commercial Managed Care - HMO | Admitting: Family Medicine

## 2016-02-26 ENCOUNTER — Other Ambulatory Visit: Payer: Self-pay | Admitting: Family Medicine

## 2016-02-27 NOTE — Telephone Encounter (Signed)
Ok plus one ref 

## 2016-05-17 ENCOUNTER — Other Ambulatory Visit: Payer: Self-pay | Admitting: Family Medicine

## 2016-05-18 NOTE — Telephone Encounter (Signed)
30 d only o v before finished

## 2016-05-28 HISTORY — PX: OTHER SURGICAL HISTORY: SHX169

## 2016-06-20 ENCOUNTER — Ambulatory Visit (INDEPENDENT_AMBULATORY_CARE_PROVIDER_SITE_OTHER): Payer: Commercial Managed Care - HMO | Admitting: Family Medicine

## 2016-06-20 ENCOUNTER — Encounter: Payer: Self-pay | Admitting: Family Medicine

## 2016-06-20 VITALS — BP 100/70 | Temp 98.5°F | Ht 63.0 in | Wt 127.5 lb

## 2016-06-20 DIAGNOSIS — F418 Other specified anxiety disorders: Secondary | ICD-10-CM | POA: Diagnosis not present

## 2016-06-20 DIAGNOSIS — J019 Acute sinusitis, unspecified: Secondary | ICD-10-CM

## 2016-06-20 DIAGNOSIS — B9689 Other specified bacterial agents as the cause of diseases classified elsewhere: Secondary | ICD-10-CM | POA: Diagnosis not present

## 2016-06-20 MED ORDER — ESCITALOPRAM OXALATE 10 MG PO TABS: 10.0000 mg | ORAL_TABLET | Freq: Every day | ORAL | 1 refills | Status: DC

## 2016-06-20 MED ORDER — ALPRAZOLAM 0.5 MG PO TABS: ORAL_TABLET | ORAL | 1 refills | Status: DC

## 2016-06-20 MED ORDER — DOXYCYCLINE HYCLATE 100 MG PO TABS: 100.0000 mg | ORAL_TABLET | Freq: Two times a day (BID) | ORAL | 0 refills | Status: AC

## 2016-06-20 NOTE — Progress Notes (Signed)
   Subjective:    Patient ID: [name], female    DOB: [birth year], 41 y.o.   MRN: [id number]  Sinusitis  This is a new problem. The current episode started in the past 7 days. The problem is unchanged. There has been no fever. The pain is moderate. Associated symptoms include ear pain, headaches and sinus pressure. Past treatments include oral decongestants. The treatment provided no relief.    Pressure and cong and stuffy  Frontal headache worse with poition, no gunkiness, some runniy nose, no cough, cdcent appetite  sinuc otc meds,      Review of Systems  HENT: Positive for ear pain and sinus pressure.   Neurological: Positive for headaches.       Objective:   Physical Exam  Alert, mild malaise. Hydration good Vitals stable. frontal/ maxillary tenderness evident positive nasal congestion. pharynx normal neck supple  lungs clear/no crackles or wheezes. heart regular in rhythm       Assessment & Plan:  Impression rhinosinusitis likely post viral, discussed with patient. plan antibiotics prescribed. Questions answered. Symptomatic care discussed. warning signs discussed. WSL Of note also discussed patient's chronic anxiety with element of depression. She tried to come off the Lexapro really could not. Still uses it faithfully notes it helps refilled for 6 months along with when necessary Xanax exercise encourage

## 2016-08-21 DIAGNOSIS — M25521 Pain in right elbow: Secondary | ICD-10-CM | POA: Diagnosis not present

## 2016-08-27 DIAGNOSIS — G5631 Lesion of radial nerve, right upper limb: Secondary | ICD-10-CM | POA: Diagnosis not present

## 2016-11-20 ENCOUNTER — Other Ambulatory Visit: Payer: Self-pay | Admitting: Family Medicine

## 2016-11-20 DIAGNOSIS — F418 Other specified anxiety disorders: Secondary | ICD-10-CM

## 2016-11-20 NOTE — Telephone Encounter (Signed)
Last seen 06/20/16

## 2016-11-20 NOTE — Telephone Encounter (Signed)
Ok plus 2 ref 

## 2016-11-21 ENCOUNTER — Encounter: Payer: Self-pay | Admitting: Family Medicine

## 2016-11-21 ENCOUNTER — Ambulatory Visit (INDEPENDENT_AMBULATORY_CARE_PROVIDER_SITE_OTHER): Payer: 59 | Admitting: Family Medicine

## 2016-11-21 VITALS — BP 118/72 | Ht 63.0 in | Wt 120.4 lb

## 2016-11-21 DIAGNOSIS — F418 Other specified anxiety disorders: Secondary | ICD-10-CM

## 2016-11-21 MED ORDER — ALPRAZOLAM 0.5 MG PO TABS: ORAL_TABLET | ORAL | 5 refills | Status: DC

## 2016-11-21 MED ORDER — ESCITALOPRAM OXALATE 10 MG PO TABS: 10.0000 mg | ORAL_TABLET | Freq: Every day | ORAL | 5 refills | Status: DC

## 2016-11-21 NOTE — Progress Notes (Signed)
   Subjective:    Patient ID: Orion CrookHollie S Faraci, female    DOB: 05/15/1976, 41 y.o.   MRN: [id number]  HPI  Patient arrives for physical for [employer]. Patient gets female physical thru GYN.  Exercising reg does well   Patient notes ongoing compliance with antidepressant medication. No obvious side effects. Reports does not miss a dose. Overall continues to help depression substantially. No thoughts of homicide or suicide. Would like to maintain medication.   Season al allergie stabl  No allergies to   Review of Systems No headache, no major weight loss or weight gain, no chest pain no back pain abdominal pain no change in bowel habits complete ROS otherwise negative     Objective:   Physical Exam Alert vitals stable, NAD. Blood pressure good on repeat. HEENT normal. Lungs clear. Heart regular rate and rhythm.        Assessment & Plan:  Depression with element of anxiety clinically stable discussed maintain same meds exercise encourage/scout form filled out

## 2016-11-23 ENCOUNTER — Other Ambulatory Visit: Payer: Self-pay | Admitting: Family Medicine

## 2016-11-23 DIAGNOSIS — F418 Other specified anxiety disorders: Secondary | ICD-10-CM

## 2017-01-18 DIAGNOSIS — J029 Acute pharyngitis, unspecified: Secondary | ICD-10-CM | POA: Diagnosis not present

## 2017-01-24 ENCOUNTER — Ambulatory Visit (INDEPENDENT_AMBULATORY_CARE_PROVIDER_SITE_OTHER): Payer: 59 | Admitting: Family Medicine

## 2017-01-24 ENCOUNTER — Encounter: Payer: Self-pay | Admitting: Family Medicine

## 2017-01-24 VITALS — BP 100/68 | Temp 98.7°F | Ht 63.0 in | Wt 121.0 lb

## 2017-01-24 DIAGNOSIS — B9689 Other specified bacterial agents as the cause of diseases classified elsewhere: Secondary | ICD-10-CM | POA: Diagnosis not present

## 2017-01-24 DIAGNOSIS — J019 Acute sinusitis, unspecified: Secondary | ICD-10-CM

## 2017-01-24 MED ORDER — CLARITHROMYCIN 500 MG PO TABS: ORAL_TABLET | ORAL | 0 refills | Status: DC

## 2017-01-24 NOTE — Progress Notes (Signed)
   Subjective:    Patient ID: [name], female    DOB: November 25, 1975, 41 y.o.   MRN: [id number]  Sinusitis  This is a new problem. The current episode started in the past 7 days. The problem has been gradually worsening since onset. There has been no fever. Past treatments include acetaminophen.   Patient has used  ice packs and with no relief. She states she had a chest cold last week and it has moved the her sinuses.Stopped up and runny nose. Throat inflammed went ot [facility]  Had an upper respinfxb  Then laryngitis  soxked in frontal, pos frontal  Using tylenol prn and ice pks  Gave pred no abx  Non smoker     Review of Systems No headache, no major weight loss or weight gain, no chest pain no back pain abdominal pain no change in bowel habits complete ROS otherwise negative     Objective:   Physical Exam Alert, mild malaise. Hydration good Vitals stable. frontal/ maxillary tenderness evident positive nasal congestion. pharynx normal neck supple  lungs clear/no crackles or wheezes. heart regular in rhythm         Assessment & Plan:  Impression rhinosinusitis likely post viral, discussed with patient. plan antibiotics prescribed. Questions answered. Symptomatic care discussed. warning signs discussed. WSL

## 2017-01-25 MED ORDER — CLARITHROMYCIN 500 MG PO TABS: ORAL_TABLET | ORAL | 0 refills | Status: DC

## 2017-04-25 ENCOUNTER — Encounter: Payer: Self-pay | Admitting: Family Medicine

## 2017-04-25 ENCOUNTER — Ambulatory Visit: Payer: 59 | Admitting: Family Medicine

## 2017-04-25 VITALS — BP 116/72 | Temp 98.3°F | Ht 63.0 in | Wt 119.0 lb

## 2017-04-25 DIAGNOSIS — J019 Acute sinusitis, unspecified: Secondary | ICD-10-CM

## 2017-04-25 MED ORDER — DOXYCYCLINE HYCLATE 100 MG PO CAPS: 100.0000 mg | ORAL_CAPSULE | Freq: Two times a day (BID) | ORAL | 0 refills | Status: DC

## 2017-04-25 MED ORDER — BENZONATATE 100 MG PO CAPS: 100.0000 mg | ORAL_CAPSULE | Freq: Three times a day (TID) | ORAL | 1 refills | Status: DC | PRN

## 2017-04-25 NOTE — Progress Notes (Signed)
   Subjective:    Patient ID: [name], female    DOB: [birth year], 41 y.o.   MRN: [id number]  Cough  This is a new problem. Associated symptoms include nasal congestion and a sore throat. She has tried OTC cough suppressant (otc cold med) for the symptoms.   Patient relates significant head congestion drainage coughing sinus pressure denies high fever chills wheezing denies vomiting diarrhea does relate some sinus pressure   Review of Systems  HENT: Positive for sore throat.   Respiratory: Positive for cough.   Negative for high fever vomiting diarrhea     Objective:   Physical Exam Eardrums are normal throat is normal neck no masses moderate sinus tenderness no respiratory distress       Assessment & Plan:  Viral syndrome Secondary rhinosinusitis Antibiotics prescribed warning signs discussed follow-up if problems

## 2017-07-18 ENCOUNTER — Telehealth: Payer: Self-pay | Admitting: Family Medicine

## 2017-07-18 ENCOUNTER — Other Ambulatory Visit: Payer: Self-pay | Admitting: *Deleted

## 2017-07-18 DIAGNOSIS — F418 Other specified anxiety disorders: Secondary | ICD-10-CM

## 2017-07-18 MED ORDER — ALPRAZOLAM 0.5 MG PO TABS: ORAL_TABLET | ORAL | 3 refills | Status: DC

## 2017-07-18 NOTE — Telephone Encounter (Signed)
Script printed await signature from doctor then fax and call pt after it was faxed.

## 2017-07-18 NOTE — Telephone Encounter (Signed)
Pt is needing refills on ALPRAZolam (XANAX) 0.5 MG tablet   [facility] [address]

## 2017-07-18 NOTE — Telephone Encounter (Signed)
Ok plus three ref monthly 

## 2017-07-19 NOTE — Telephone Encounter (Signed)
Script sent yesterday. Pt notified today

## 2017-09-05 ENCOUNTER — Telehealth: Payer: Self-pay | Admitting: Family Medicine

## 2017-09-05 DIAGNOSIS — F418 Other specified anxiety disorders: Secondary | ICD-10-CM

## 2017-09-05 MED ORDER — ESCITALOPRAM OXALATE 10 MG PO TABS: 10.0000 mg | ORAL_TABLET | Freq: Every day | ORAL | 3 refills | Status: DC

## 2017-09-05 NOTE — Telephone Encounter (Signed)
Three mo worth ok rec chronic visit in June to do yrly eval for this

## 2017-09-05 NOTE — Telephone Encounter (Signed)
Spoke with patient and informed her refills were sent in and to get an appt in June for yearly eval; verbalized understanding

## 2017-09-05 NOTE — Telephone Encounter (Signed)
Patient is requesting a refill on escitalopram 10 mg tab.  She is completely out.  [facility] [address]

## 2017-11-12 ENCOUNTER — Encounter: Payer: Self-pay | Admitting: Family Medicine

## 2017-11-12 ENCOUNTER — Ambulatory Visit (INDEPENDENT_AMBULATORY_CARE_PROVIDER_SITE_OTHER): Payer: 59 | Admitting: Family Medicine

## 2017-11-12 VITALS — BP 102/68 | Ht 62.5 in | Wt 124.0 lb

## 2017-11-12 DIAGNOSIS — Z0000 Encounter for general adult medical examination without abnormal findings: Secondary | ICD-10-CM

## 2017-11-12 NOTE — Progress Notes (Signed)
   Subjective:    Patient ID: [name], female    DOB: 03-02-1976, 42 y.o.   MRN: [id number]  HPI  The patient comes in today for a wellness visit.    A review of their health history was completed.  A review of medications was also completed.  Any needed refills; no  Eating habits: pretty healthy  Falls/  MVA accidents in past few months: none  Regular exercise: yes  Specialist pt sees on regular basis: none  Preventative health issues were discussed.   Additional concerns: none  Reg exercsie   Three times per wk  Eats the right things but not a lot   Sweet tooth   br  Ca no colonc c a  Came off the meds for anxiety and mood disorder,   Off the meds   Has had mammogram    Review of Systems  Constitutional: Negative for activity change, appetite change and fatigue.  HENT: Negative for congestion and rhinorrhea.   Eyes: Negative for discharge.  Respiratory: Negative for cough, chest tightness and wheezing.   Cardiovascular: Negative for chest pain.  Gastrointestinal: Negative for abdominal pain, blood in stool and vomiting.  Endocrine: Negative for polyphagia.  Genitourinary: Negative for difficulty urinating and frequency.  Musculoskeletal: Negative for neck pain.  Skin: Negative for color change.  Allergic/Immunologic: Negative for environmental allergies and food allergies.  Neurological: Negative for weakness and headaches.  Psychiatric/Behavioral: Negative for agitation and behavioral problems.  All other systems reviewed and are negative.      Objective:   Physical Exam  Constitutional: She is oriented to person, place, and time. She appears well-developed and well-nourished.  HENT:  Head: Normocephalic and atraumatic.  Right Ear: External ear normal.  Left Ear: External ear normal.  Eyes: Right eye exhibits no discharge. Left eye exhibits no discharge.  Neck: Normal range of motion. No tracheal deviation present.  Cardiovascular: Normal  rate, regular rhythm, normal heart sounds and intact distal pulses. Exam reveals no gallop.  No murmur heard. Pulmonary/Chest: Effort normal and breath sounds normal. No stridor. No respiratory distress. She has no wheezes. She has no rales.  Abdominal: Soft. Bowel sounds are normal. She exhibits no distension and no mass. There is no tenderness. There is no rebound and no guarding.  Musculoskeletal: Normal range of motion. She exhibits no edema or tenderness.  Lymphadenopathy:    She has no cervical adenopathy.  Neurological: She is alert and oriented to person, place, and time. She exhibits normal muscle tone.  Skin: Skin is warm and dry.  Psychiatric: She has a normal mood and affect. Her behavior is normal.  Vitals reviewed.         Assessment & Plan:  1 #1 wellness exam.  Diet discussed.  Exercise discussed.  Vaccines discussed.  Anticipatory guidance given.  Patient receives GYN checkups elsewhere.  Has been under therapy for depression with element of anxiety.  Has come off all needs medicines.  States doing well off of the medicines.  History of seizure disorder but no seizures for greater than 3 years thankfully.Up-to-date on mammogram and blood work

## 2018-01-19 ENCOUNTER — Other Ambulatory Visit: Payer: Self-pay | Admitting: Family Medicine

## 2018-01-19 DIAGNOSIS — F418 Other specified anxiety disorders: Secondary | ICD-10-CM

## 2018-01-20 NOTE — Telephone Encounter (Signed)
Ok plus 3 monthly ref 

## 2018-06-02 ENCOUNTER — Other Ambulatory Visit: Payer: Self-pay | Admitting: *Deleted

## 2018-06-02 ENCOUNTER — Telehealth: Payer: Self-pay | Admitting: Family Medicine

## 2018-06-02 MED ORDER — VALACYCLOVIR HCL 1 G PO TABS: ORAL_TABLET | ORAL | 6 refills | Status: DC

## 2018-06-02 NOTE — Telephone Encounter (Signed)
Patient is requesting refill on valacyclovir 1000 mg for a fever blister. [facility]

## 2018-06-02 NOTE — Telephone Encounter (Signed)
rx sent to pharm. Left message to return call

## 2018-06-02 NOTE — Telephone Encounter (Signed)
2 now 2 12 hrs later numb 4, 6 ref

## 2018-06-05 NOTE — Telephone Encounter (Signed)
Left message to return call 

## 2018-06-11 NOTE — Telephone Encounter (Signed)
I called left a message to have the pt to r/c.

## 2018-06-12 NOTE — Telephone Encounter (Signed)
Left message to return call 

## 2018-06-12 NOTE — Telephone Encounter (Signed)
Pt picked medication up per pharmacy

## 2018-08-10 ENCOUNTER — Other Ambulatory Visit: Payer: Self-pay | Admitting: Family Medicine

## 2018-08-10 DIAGNOSIS — F418 Other specified anxiety disorders: Secondary | ICD-10-CM

## 2018-08-11 NOTE — Telephone Encounter (Signed)
Ok six mo worth 

## 2019-01-19 ENCOUNTER — Telehealth: Payer: Self-pay | Admitting: Family Medicine

## 2019-01-19 NOTE — Telephone Encounter (Signed)
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

## 2019-01-19 NOTE — Telephone Encounter (Signed)
Pt takes valACYclovir (VALTREX) 1000 MG tablet when cold sores pop up. But she is getting them more frequently and would like to know if she can start taking them daily as a preventive measure.   [id number] - [place], [place] AT [place]

## 2019-01-19 NOTE — Telephone Encounter (Signed)
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 Patient verbalized understanding.

## 2019-01-19 NOTE — Telephone Encounter (Signed)
Left message to return call 

## 2019-01-26 ENCOUNTER — Other Ambulatory Visit: Payer: Self-pay | Admitting: Obstetrics and Gynecology

## 2019-01-26 DIAGNOSIS — R928 Other abnormal and inconclusive findings on diagnostic imaging of breast: Secondary | ICD-10-CM

## 2019-01-29 ENCOUNTER — Other Ambulatory Visit: Payer: Self-pay | Admitting: Obstetrics and Gynecology

## 2019-01-29 ENCOUNTER — Ambulatory Visit
Admission: RE | Admit: 2019-01-29 | Discharge: 2019-01-29 | Disposition: A | Discharge status: Home / Self Care | Payer: 59 | Source: Ambulatory Visit | Attending: Obstetrics and Gynecology | Admitting: Obstetrics and Gynecology

## 2019-01-29 ENCOUNTER — Other Ambulatory Visit: Payer: Self-pay

## 2019-01-29 ENCOUNTER — Ambulatory Visit
Admission: RE | Admit: 2019-01-29 | Discharge: 2019-01-29 | Disposition: A | Discharge status: Home / Self Care | Payer: Commercial Managed Care - HMO | Source: Ambulatory Visit | Attending: Obstetrics and Gynecology | Admitting: Obstetrics and Gynecology

## 2019-01-29 DIAGNOSIS — R928 Other abnormal and inconclusive findings on diagnostic imaging of breast: Secondary | ICD-10-CM

## 2019-01-29 DIAGNOSIS — N6320 Unspecified lump in the left breast, unspecified quadrant: Secondary | ICD-10-CM

## 2019-04-21 ENCOUNTER — Other Ambulatory Visit: Payer: Self-pay

## 2019-04-21 DIAGNOSIS — Z20822 Contact with and (suspected) exposure to covid-19: Secondary | ICD-10-CM

## 2019-04-22 ENCOUNTER — Other Ambulatory Visit: Payer: Self-pay | Admitting: Family Medicine

## 2019-04-22 DIAGNOSIS — F418 Other specified anxiety disorders: Secondary | ICD-10-CM

## 2019-04-22 LAB — NOVEL CORONAVIRUS, NAA: SARS-CoV-2, NAA: NOT DETECTED

## 2019-04-24 NOTE — Telephone Encounter (Signed)
One mo worth now, sched f u visit

## 2019-04-26 ENCOUNTER — Other Ambulatory Visit: Payer: Self-pay | Admitting: Family Medicine

## 2019-04-26 DIAGNOSIS — F418 Other specified anxiety disorders: Secondary | ICD-10-CM

## 2019-04-27 NOTE — Telephone Encounter (Signed)
lvm to schedule virtual   

## 2019-04-27 NOTE — Telephone Encounter (Signed)
Call and sched virt appt, then may ref times one

## 2019-05-11 ENCOUNTER — Other Ambulatory Visit: Payer: Self-pay

## 2019-05-11 ENCOUNTER — Encounter: Payer: Self-pay | Admitting: Family Medicine

## 2019-05-11 ENCOUNTER — Ambulatory Visit (INDEPENDENT_AMBULATORY_CARE_PROVIDER_SITE_OTHER): Payer: 59 | Admitting: Family Medicine

## 2019-05-11 DIAGNOSIS — Z20828 Contact with and (suspected) exposure to other viral communicable diseases: Secondary | ICD-10-CM | POA: Diagnosis not present

## 2019-05-11 DIAGNOSIS — Z20822 Contact with and (suspected) exposure to covid-19: Secondary | ICD-10-CM

## 2019-05-11 NOTE — Progress Notes (Signed)
   Subjective:  Audio plus video  Patient ID: [name], female    DOB: May 07, 1976, 43 y.o.   MRN: [id number]  Sinusitis This is a new problem. Episode onset: 3 days. Associated symptoms include congestion and coughing. Treatments tried: dayquil, nyquil.   Virtual Visit via Video Note  I connected with [name] on 05/11/19 at 11:00 AM EST by a video enabled telemedicine application and verified that I am speaking with the correct person using two identifiers.  Location: Patient: home Provider: office   I discussed the limitations of evaluation and management by telemedicine and the availability of in person appointments. The patient expressed understanding and agreed to proceed.  History of Present Illness:    Observations/Objective:   Assessment and Plan:   Follow Up Instructions:    I discussed the assessment and treatment plan with the patient. The patient was provided an opportunity to ask questions and all were answered. The patient agreed with the plan and demonstrated an understanding of the instructions.   The patient was advised to call back or seek an in-person evaluation if the symptoms worsen or if the condition fails to improve as anticipated.  I provided 17 minutes of non-face-to-face time during this encounter.  2 others within family similar symptoms.  Cough congestion.  Sore throat.  Achiness.  Some headache off and on fairly significant.  No fever.  Now has moved into the chest.  Some fatigue and element of shortness of breath with exertion no wheezing.  Non-smoker.    Review of Systems  HENT: Positive for congestion.   Respiratory: Positive for cough.        Objective:   Physical Exam  Virtual talking in full sentences no acute distress      Assessment & Plan:  Impression probable COVID-19.  Discussed.  3 in the family with similar symptomatology.  Potential for exposures.  Warning signs discussed.  Covid testing  recommended.  Oxygen saturation monitoring discussed

## 2019-05-13 ENCOUNTER — Other Ambulatory Visit: Payer: Self-pay

## 2019-05-13 ENCOUNTER — Ambulatory Visit: Payer: 59 | Attending: Internal Medicine

## 2019-05-13 DIAGNOSIS — Z20822 Contact with and (suspected) exposure to covid-19: Secondary | ICD-10-CM

## 2019-05-14 LAB — NOVEL CORONAVIRUS, NAA: SARS-CoV-2, NAA: NOT DETECTED

## 2019-06-29 ENCOUNTER — Encounter: Payer: Self-pay | Admitting: Family Medicine

## 2019-07-23 ENCOUNTER — Ambulatory Visit (INDEPENDENT_AMBULATORY_CARE_PROVIDER_SITE_OTHER): Payer: 59 | Admitting: Family Medicine

## 2019-07-23 ENCOUNTER — Other Ambulatory Visit: Payer: Self-pay

## 2019-07-23 DIAGNOSIS — F418 Other specified anxiety disorders: Secondary | ICD-10-CM

## 2019-07-23 MED ORDER — ALPRAZOLAM 0.5 MG PO TABS: 0.5000 mg | ORAL_TABLET | Freq: Two times a day (BID) | ORAL | 5 refills | Status: DC

## 2019-07-23 NOTE — Progress Notes (Signed)
   Subjective:  Audio  Patient ID: [name], female    DOB: 01/25/1976, 44 y.o.   MRN: [id number]  HPI Pt needing follow up appt for depression with anxiety. Pt states she is no longer taking Lexapro 10 mg. Pt is still taking Xanax 0.5 mg BID. GAD 7 and PHQ 9 completed  Virtual Visit via Telephone Note  I connected with [name] on 07/23/19 at  2:30 PM EST by telephone and verified that I am speaking with the correct person using two identifiers.  Location: Patient: home Provider: office   I discussed the limitations, risks, security and privacy concerns of performing an evaluation and management service by telephone and the availability of in person appointments. I also discussed with the patient that there may be a patient responsible charge related to this service. The patient expressed understanding and agreed to proceed.   History of Present Illness:    Observations/Objective:   Assessment and Plan:   Follow Up Instructions:    I discussed the assessment and treatment plan with the patient. The patient was provided an opportunity to ask questions and all were answered. The patient agreed with the plan and demonstrated an understanding of the instructions.   The patient was advised to call back or seek an in-person evaluation if the symptoms worsen or if the condition fails to improve as anticipated.  I provided 22 minutes of non-face-to-face time during this encounter.       Review of Systems No headache no chest pain no shortness of breath    Objective:   Physical Exam  Virtual      Assessment & Plan:  Impression insomnia/anxiety discussed.  Patient would prefer not to take a daily agent for her mood.  Patient was on Lexapro but would prefer not to take it any longer.  No suicidal no homicidal thoughts.  Overall depression good control.  Still some ongoing anxiety.  She would prefer to have Xanax on hand to take as needed.  Feels  her stress level overall is manageable.  Exercise encouraged.  Medications refilled

## 2019-07-30 ENCOUNTER — Ambulatory Visit
Admission: RE | Admit: 2019-07-30 | Discharge: 2019-07-30 | Disposition: A | Discharge status: Home / Self Care | Payer: 59 | Source: Ambulatory Visit | Attending: Obstetrics and Gynecology | Admitting: Obstetrics and Gynecology

## 2019-07-30 ENCOUNTER — Other Ambulatory Visit: Payer: Self-pay | Admitting: Obstetrics and Gynecology

## 2019-07-30 ENCOUNTER — Other Ambulatory Visit: Payer: Self-pay

## 2019-07-30 DIAGNOSIS — N6320 Unspecified lump in the left breast, unspecified quadrant: Secondary | ICD-10-CM

## 2019-11-20 ENCOUNTER — Other Ambulatory Visit: Payer: Self-pay | Admitting: Orthopedic Surgery

## 2019-11-20 DIAGNOSIS — M79644 Pain in right finger(s): Secondary | ICD-10-CM

## 2019-11-20 DIAGNOSIS — S63632A Sprain of interphalangeal joint of right middle finger, initial encounter: Secondary | ICD-10-CM

## 2020-02-02 ENCOUNTER — Other Ambulatory Visit: Payer: 59

## 2020-02-12 ENCOUNTER — Other Ambulatory Visit: Payer: 59

## 2020-02-24 ENCOUNTER — Ambulatory Visit: Payer: 59 | Admitting: Family Medicine

## 2020-03-15 ENCOUNTER — Ambulatory Visit (INDEPENDENT_AMBULATORY_CARE_PROVIDER_SITE_OTHER): Payer: 59 | Admitting: Internal Medicine

## 2020-03-16 NOTE — Telephone Encounter (Signed)
Copied message into pt's chart and sent to dr scott

## 2020-04-07 ENCOUNTER — Encounter (INDEPENDENT_AMBULATORY_CARE_PROVIDER_SITE_OTHER): Payer: Self-pay | Admitting: Nurse Practitioner

## 2020-04-07 ENCOUNTER — Ambulatory Visit (INDEPENDENT_AMBULATORY_CARE_PROVIDER_SITE_OTHER): Payer: 59 | Admitting: Nurse Practitioner

## 2020-04-07 ENCOUNTER — Other Ambulatory Visit: Payer: Self-pay

## 2020-04-07 VITALS — BP 112/70 | HR 82 | Temp 97.7°F | Resp 16 | Ht 63.0 in | Wt 125.4 lb

## 2020-04-07 DIAGNOSIS — F331 Major depressive disorder, recurrent, moderate: Secondary | ICD-10-CM

## 2020-04-07 DIAGNOSIS — R5383 Other fatigue: Secondary | ICD-10-CM

## 2020-04-07 DIAGNOSIS — Z13220 Encounter for screening for lipoid disorders: Secondary | ICD-10-CM | POA: Diagnosis not present

## 2020-04-07 DIAGNOSIS — Z131 Encounter for screening for diabetes mellitus: Secondary | ICD-10-CM

## 2020-04-07 MED ORDER — ESCITALOPRAM OXALATE 10 MG PO TABS: 10.0000 mg | ORAL_TABLET | Freq: Every day | ORAL | 1 refills | Status: DC

## 2020-04-07 NOTE — Patient Instructions (Signed)
To schedule an appointment with [facility] for your lab draw visit [email or web address] or Call: [phone]. Or you may go to [facility] as a walk-in. Their [place] location address is [address], [place], [place]. Their hours are Monday-Friday from 7:00AM-12:00PM and 1:00PM-5:00PM.    

## 2020-04-07 NOTE — Progress Notes (Signed)
Subjective:  Patient ID: [name], female    DOB: 1976/02/03  Age: 44 y.o. MRN: [id number]  CC:  Chief Complaint  Patient presents with  . Establish Care  . Depression      HPI  This patient arrives today for the above.  She is here to establish care at this office.  Her previous provider has retired and she is here to establish care as her new primary care provider.  She tells me generally she has no complaints or significant medical history except she does feel that she is a bit depressed.  She has been on Wellbutrin as well as Lexapro in the past.  She tells me she was tolerating Wellbutrin well, however she was on a low dose and when they went to increase ago she actually experienced a seizure.  So she stopped taking that.  She was on Lexapro, but wanted to try off of it to see if she can handle her depression without pharmacological therapy.  She has undergone counseling in the past and felt that this was mildly beneficial.  She does sometimes have suicidal thoughts but does not have any plan to act on these thoughts.  Past Medical History:  Diagnosis Date  . Cold sore   . Depression       Family History  Problem Relation Age of Onset  . Diabetes Father   . Depression Mother     Social History   Social History Narrative  . Not on file   Social History   Tobacco Use  . Smoking status: Former Smoker    Packs/day: 0.25    Years: 6.00    Pack years: 1.50    Types: Cigarettes  . Smokeless tobacco: Never Used  Substance Use Topics  . Alcohol use: No     Current Meds  Medication Sig  . ALPRAZolam (XANAX) 0.5 MG tablet Take 1 tablet (0.5 mg total) by mouth 2 (two) times daily.  Marland Kitchen oxymetazoline (NASAL SPRAY 12 HOUR) 0.05 % nasal spray Place 1 spray into both nostrils 2 (two) times daily as needed for congestion.  . valACYclovir (VALTREX) 1000 MG tablet Take 2,000 mg by mouth once.    ROS:  Review of Systems  Constitutional: Positive for  malaise/fatigue.  Respiratory: Negative for shortness of breath.   Cardiovascular: Negative for chest pain.  Psychiatric/Behavioral: Positive for depression and suicidal ideas. Negative for hallucinations. The patient has insomnia. The patient is not nervous/anxious.      Objective:   Today's Vitals: BP 112/70   Pulse 82   Temp 97.7 F (36.5 C)   Resp 16   Ht 5' 3"  (1.6 m)   Wt 125 lb 6.4 oz (56.9 kg)   LMP 02/29/2020   SpO2 98%   BMI 22.21 kg/m  Vitals with BMI 04/07/2020 11/12/2017 04/25/2017  Height 5' 3"  5' 2.5" 5' 3"   Weight 125 lbs 6 oz 124 lbs 119 lbs  BMI 22.22 71.0 62.69  Systolic 485 462 703  Diastolic 70 68 72  Pulse 82 - -     Physical Exam Vitals reviewed.  Constitutional:      General: She is not in acute distress.    Appearance: Normal appearance.  HENT:     Head: Normocephalic and atraumatic.  Neck:     Vascular: No carotid bruit.  Cardiovascular:     Rate and Rhythm: Normal rate and regular rhythm.     Pulses: Normal pulses.     Heart  sounds: Normal heart sounds.  Pulmonary:     Effort: Pulmonary effort is normal.     Breath sounds: Normal breath sounds.  Skin:    General: Skin is warm and dry.  Neurological:     General: No focal deficit present.     Mental Status: She is alert and oriented to person, place, and time.  Psychiatric:        Mood and Affect: Mood normal.        Behavior: Behavior normal.        Judgment: Judgment normal.       PHQ9 SCORE ONLY 04/07/2020 07/23/2019 11/12/2017  PHQ-9 Total Score 21 8 0      Assessment and Plan   1. Moderate episode of recurrent major depressive disorder ([place])   2. Lipid screening   3. Screening for diabetes mellitus   4. Fatigue, unspecified type      Plan: 1.-4.  We discussed treatment options regarding her depression.  She would like to try pharmacological therapy and would be open to restarting counseling as well.  Will initiate her on Lexapro 10 mg by mouth daily.  Will send Rx  to pharmacy today.  We will also refer to psychiatry for counseling.  We will also collect blood work for further evaluation, and she will follow-up in approximately 4 to 6 weeks for her annual physical exam   Tests ordered Orders Placed This Encounter  Procedures  . CBC with Differential/Platelets  . CMP with eGFR([facility])  . Lipid Panel  . Hemoglobin A1c  . TSH  . T3, Free  . T4, Free      Meds ordered this encounter  Medications  . escitalopram (LEXAPRO) 10 MG tablet    Sig: Take 1 tablet (10 mg total) by mouth daily.    Dispense:  90 tablet    Refill:  1    Order Specific Question:   Supervising Provider    Answer:   Doree Albee [[id number]]    Patient to follow-up in 4-6 weeks or sooner as needed.  Ailene Ards, NP

## 2020-04-11 ENCOUNTER — Telehealth (INDEPENDENT_AMBULATORY_CARE_PROVIDER_SITE_OTHER): Payer: Self-pay

## 2020-04-11 DIAGNOSIS — F418 Other specified anxiety disorders: Secondary | ICD-10-CM

## 2020-04-11 NOTE — Telephone Encounter (Signed)
Patient called and left a message that she forgot to request the following medication:  ALPRAZolam (XANAX) 0.5 MG tablet  Last filled 07/23/2019, # 60 with 5 refills (By Dr. Ardyth Gal, MD)  Last OV 04/07/2020

## 2020-04-12 MED ORDER — ALPRAZOLAM 0.5 MG PO TABS: 0.5000 mg | ORAL_TABLET | Freq: Two times a day (BID) | ORAL | 0 refills | Status: DC

## 2020-04-12 NOTE — Telephone Encounter (Signed)
Refill ordered to [facility] on [address]

## 2020-04-13 ENCOUNTER — Other Ambulatory Visit: Payer: 59

## 2020-04-13 ENCOUNTER — Telehealth (INDEPENDENT_AMBULATORY_CARE_PROVIDER_SITE_OTHER): Payer: Self-pay | Admitting: Licensed Clinical Social Worker

## 2020-04-13 ENCOUNTER — Telehealth (INDEPENDENT_AMBULATORY_CARE_PROVIDER_SITE_OTHER): Payer: Self-pay

## 2020-04-13 NOTE — Telephone Encounter (Signed)
Left message encouraging contact 

## 2020-04-13 NOTE — Telephone Encounter (Signed)
I would recommend that she split the tablet in half (it should be scored) so that she will be taking Lexapro 5 mg daily and continue with this until I see her for her annual physical in December.

## 2020-04-13 NOTE — Telephone Encounter (Signed)
Patient called and left a voice message and stated that the Lexapro 10 mg is making her very nauseous and causing a headache and making her sick. Patient wants to know what she needs to do?  Please advise.

## 2020-04-13 NOTE — Telephone Encounter (Signed)
Called patient and LMOVM to return call  Called patient and left a detailed voice message and gave patient the message from Dr. Karilyn Cota.

## 2020-04-29 ENCOUNTER — Telehealth (INDEPENDENT_AMBULATORY_CARE_PROVIDER_SITE_OTHER): Payer: Self-pay | Admitting: Licensed Clinical Social Worker

## 2020-04-29 NOTE — Telephone Encounter (Signed)
Left vmail encouraging contact

## 2020-05-02 ENCOUNTER — Telehealth (INDEPENDENT_AMBULATORY_CARE_PROVIDER_SITE_OTHER): Payer: Self-pay | Admitting: Licensed Clinical Social Worker

## 2020-05-02 NOTE — Telephone Encounter (Signed)
Left message encouraging call back °

## 2020-05-09 ENCOUNTER — Telehealth (INDEPENDENT_AMBULATORY_CARE_PROVIDER_SITE_OTHER): Payer: Self-pay

## 2020-05-09 NOTE — Telephone Encounter (Signed)
Patient called and left a detailed voice message and stated that she has not been able to sleep at night and has been going on for weeks. Patient stated that she has tried Melatonin and other natural sleeping aides and not working at all and she is asking for something to help her sleep. Please advise.

## 2020-05-09 NOTE — Telephone Encounter (Signed)
Melatonin is not regulated by the FDA so it all depends on where she got the melatonin from.  In my experience, if she gets melatonin from the following website, this should work better.  The website is the following: [employer]. In the search bar, she should look for "melatonin".  She will see several melatonin doses of.  And she wants melatonin 1 mg capsule.  The bottle contains 60 capsules. She should try 1 capsule for the first 2 nights and keep escalating by 1 capsule every couple of nights until she gets the desired effect of sleep.  One cannot overdose on melatonin.

## 2020-05-09 NOTE — Telephone Encounter (Signed)
Called patient and gave her the message from Dr. Karilyn Cota. She thanked [place] and stated that she will give this a try and will contact us if she has any difficulty. Patient verbalized an understanding.

## 2020-05-17 ENCOUNTER — Encounter (INDEPENDENT_AMBULATORY_CARE_PROVIDER_SITE_OTHER): Payer: Self-pay | Admitting: Internal Medicine

## 2020-05-17 ENCOUNTER — Other Ambulatory Visit: Payer: Self-pay

## 2020-05-17 ENCOUNTER — Ambulatory Visit (INDEPENDENT_AMBULATORY_CARE_PROVIDER_SITE_OTHER): Payer: 59 | Admitting: Internal Medicine

## 2020-05-17 VITALS — BP 108/78 | HR 70 | Temp 97.3°F | Ht 62.0 in | Wt 123.8 lb

## 2020-05-17 DIAGNOSIS — E559 Vitamin D deficiency, unspecified: Secondary | ICD-10-CM

## 2020-05-17 DIAGNOSIS — F520 Hypoactive sexual desire disorder: Secondary | ICD-10-CM

## 2020-05-17 DIAGNOSIS — Z0000 Encounter for general adult medical examination without abnormal findings: Secondary | ICD-10-CM

## 2020-05-17 NOTE — Progress Notes (Signed)
Chief Complaint: This delightful 44 year old lady comes in for an annual physical exam. HPI: She has fatigue, especially in the afternoon to the degree that she feels that she can just go to sleep. She has a history of anxiety and depression and Lexapro seems to have helped her that Maralyn Sago initiated. She has problems with insomnia. For the last 3 to 4 years, she has noticed decreased libido which is quite significant and causes issues with her husband and her relationship with her husband. Approximately 1 year ago, her menstrual cycles became irregular and she is now on birth control which seems to have regulated them. She denies any excessive acne or hirsutism.  There is no history of miscarriages and no history of previous irregular cycles.  Past Medical History:  Diagnosis Date  . Anxiety   . Cold sore   . Depression   . Seizures (HCC) 2017   Only 1 seizure   Past Surgical History:  Procedure Laterality Date  . CESAREAN SECTION  2006.2008   2  . OTHER SURGICAL HISTORY Right 2018   Radial Tunnel surgery  . 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    Social History   Tobacco Use  . Smoking status: Former Smoker    Packs/day: 0.25    Years: 6.00    Pack years: 1.50    Types: Cigarettes  . Smokeless tobacco: Never Used  Substance Use Topics  . Alcohol use: No      Allergies:  Allergies  Allergen Reactions  . Penicillins Hives    Has patient had a PCN reaction causing immediate rash, facial/tongue/throat swelling, SOB or lightheadedness with hypotension: Yes Has patient had a PCN reaction causing severe rash involving mucus membranes or skin necrosis: No Has patient had a PCN reaction that required hospitalization No Has patient had a PCN reaction occurring within the last 10 years: No If all of the above answers are "NO", then may  proceed with Cephalosporin use.   . Wellbutrin [Bupropion] Other (See Comments)    Seizure     Current Meds  Medication Sig  . AUROVELA FE 1.5/30 1.5-30 MG-MCG tablet Take 1 tablet by mouth daily.  . clobetasol cream (TEMOVATE) 0.05 % clobetasol 0.05 % topical cream  APPLY EXTERNALLY TO THE AFFECTED AREA THREE TIMES DAILY  . escitalopram (LEXAPRO) 10 MG tablet Take 1 tablet (10 mg total) by mouth daily.  Marland Kitchen oxymetazoline (AFRIN) 0.05 % nasal spray Place 1 spray into both nostrils 2 (two) times daily as needed for congestion.  . valACYclovir (VALTREX) 500 MG tablet Take 500 mg by mouth daily.  . [DISCONTINUED] ALPRAZolam (XANAX) 0.5 MG tablet Take 1 tablet (0.5 mg total) by mouth 2 (two) times daily.       Depression screen [facility] 2/9 04/07/2020 07/23/2019 11/12/2017  Decreased Interest 3 1 0  Down, Depressed, Hopeless 2 1 0  PHQ - 2 Score 5 2 0  Altered sleeping 3 2 -  Tired, decreased energy 3 0 -  Change in appetite 2 0 -  Feeling bad or failure about yourself  3 1 -  Trouble concentrating 2 3 -  Moving slowly or fidgety/restless 2 0 -  Suicidal thoughts 1 0 -  PHQ-9 Score 21 8 -  Difficult doing work/chores - Not difficult at all -     ZSW:[id number] from the symptoms mentioned above,there  are no other symptoms referable to all systems reviewed.       Physical Exam: Blood pressure 108/78, pulse 70, temperature (!) 97.3 F (36.3 C), temperature source Temporal, height 5\' 2"  (1.575 m), weight 123 lb 12.8 oz (56.2 kg), SpO2 98 %. Vitals with BMI 05/17/2020 04/07/2020 11/12/2017  Height 5\' 2"  5\' 3"  5' 2.5"  Weight 123 lbs 13 oz 125 lbs 6 oz 124 lbs  BMI 22.64 22.22 22.3  Systolic 108 112 11/14/2017  Diastolic 78 70 68  Pulse 70 82 -      She looks systemically well.  Healthy weight. General: Alert, cooperative, and appears to be the stated age.No pallor.  No jaundice.  No clubbing. Head: Normocephalic Eyes: Sclera white, pupils equal and reactive to light, red reflex x 2,   Ears: Normal bilaterally Oral cavity: Lips, mucosa, and tongue normal: Teeth and gums normal Neck: No adenopathy, supple, symmetrical, trachea midline, and thyroid does not appear enlarged. Breasts: Implants felt without any masses. Respiratory: Clear to auscultation bilaterally.No wheezing, crackles or bronchial breathing. Cardiovascular: Heart sounds are present and appear to be normal without murmurs or added sounds.  No carotid bruits.  Peripheral pulses are present and equal bilaterally.: Gastrointestinal:positive bowel sounds, no hepatosplenomegaly.  No masses felt.No tenderness. Skin: Clear, No rashes noted.No worrisome skin lesions seen. Neurological: Grossly intact without focal findings, cranial nerves II through XII intact, muscle strength equal bilaterally Musculoskeletal: No acute joint abnormalities noted.Full range of movement noted with joints. Psychiatric: Affect appropriate, non-anxious.    Assessment  1. Routine general medical examination at a health care facility   2. Vitamin D deficiency disease   3. Hypoactive sexual desire disorder     Tests Ordered:   Orders Placed This Encounter  Procedures  . VITAMIN D 25 Hydroxy (Vit-D Deficiency, Fractures)  . Testos,Total,Free and SHBG (Female)     Plan  1. This is a reasonably healthy 44 year old lady who would like to improve her wellbeing. 2. Blood work is ordered. 3. I described the philosophy of our practice and I will see her in the next several weeks to discuss all her blood work results, including the ones that were ordered by , with further recommendations.     No orders of the defined types were placed in this encounter.    Venida Tsukamoto C Laurey Salser   05/17/2020, 4:03 PM

## 2020-05-18 ENCOUNTER — Ambulatory Visit
Admission: RE | Admit: 2020-05-18 | Discharge: 2020-05-18 | Disposition: A | Discharge status: Home / Self Care | Payer: 59 | Source: Ambulatory Visit | Attending: Obstetrics and Gynecology | Admitting: Obstetrics and Gynecology

## 2020-05-18 DIAGNOSIS — N6320 Unspecified lump in the left breast, unspecified quadrant: Secondary | ICD-10-CM

## 2020-05-18 LAB — CBC WITH DIFFERENTIAL/PLATELET
Absolute Monocytes: 669 cells/uL (ref 200–950)
Basophils Absolute: 88 cells/uL (ref 0–200)
Basophils Relative: 1 %
Eosinophils Absolute: 88 cells/uL (ref 15–500)
Eosinophils Relative: 1 %
HCT: 36.3 % (ref 35.0–45.0)
Hemoglobin: 12 g/dL (ref 11.7–15.5)
Lymphs Abs: 2235 cells/uL (ref 850–3900)
MCH: 27 pg (ref 27.0–33.0)
MCHC: 33.1 g/dL (ref 32.0–36.0)
MCV: 81.8 fL (ref 80.0–100.0)
MPV: 10.3 fL (ref 7.5–12.5)
Monocytes Relative: 7.6 %
Neutro Abs: 5720 cells/uL (ref 1500–7800)
Neutrophils Relative %: 65 %
Platelets: 307 10*3/uL (ref 140–400)
RBC: 4.44 10*6/uL (ref 3.80–5.10)
RDW: 11.9 % (ref 11.0–15.0)
Total Lymphocyte: 25.4 %
WBC: 8.8 10*3/uL (ref 3.8–10.8)

## 2020-05-18 LAB — T3, FREE: T3, Free: 2.4 pg/mL (ref 2.3–4.2)

## 2020-05-18 LAB — COMPLETE METABOLIC PANEL WITH GFR
AG Ratio: 1.8 (calc) (ref 1.0–2.5)
ALT: 12 U/L (ref 6–29)
AST: 21 U/L (ref 10–35)
Albumin: 4.1 g/dL (ref 3.6–5.1)
Alkaline phosphatase (APISO): 33 U/L (ref 31–125)
BUN: 19 mg/dL (ref 7–25)
CO2: 27 mmol/L (ref 20–32)
Calcium: 8.9 mg/dL (ref 8.6–10.2)
Chloride: 105 mmol/L (ref 98–110)
Creat: 0.78 mg/dL (ref 0.50–1.10)
GFR, Est African American: 106 mL/min/{1.73_m2} (ref >=60)
GFR, Est Non African American: 92 mL/min/{1.73_m2} (ref >=60)
Globulin: 2.3 g/dL (calc) (ref 1.9–3.7)
Glucose, Bld: 81 mg/dL (ref 65–139)
Potassium: 4.1 mmol/L (ref 3.5–5.3)
Sodium: 138 mmol/L (ref 135–146)
Total Bilirubin: 0.2 mg/dL (ref 0.2–1.2)
Total Protein: 6.4 g/dL (ref 6.1–8.1)

## 2020-05-18 LAB — LIPID PANEL
Cholesterol: 161 mg/dL (ref <200)
HDL: 53 mg/dL (ref >=50)
LDL Cholesterol (Calc): 87 mg/dL (calc)
Non-HDL Cholesterol (Calc): 108 mg/dL (calc) (ref <130)
Total CHOL/HDL Ratio: 3 (calc) (ref <5.0)
Triglycerides: 113 mg/dL (ref <150)

## 2020-05-18 LAB — HEMOGLOBIN A1C
Hgb A1c MFr Bld: 5 % of total Hgb (ref <5.7)
Mean Plasma Glucose: 97 mg/dL
eAG (mmol/L): 5.4 mmol/L

## 2020-05-18 LAB — TSH: TSH: 3.25 mIU/L

## 2020-05-18 LAB — T4, FREE: Free T4: 0.9 ng/dL (ref 0.8–1.8)

## 2020-05-23 LAB — VITAMIN D 25 HYDROXY (VIT D DEFICIENCY, FRACTURES): Vit D, 25-Hydroxy: 33 ng/mL (ref 30–100)

## 2020-05-23 LAB — TESTOS,TOTAL,FREE AND SHBG (FEMALE)
Free Testosterone: 0.9 pg/mL (ref 0.1–6.4)
Sex Hormone Binding: 118 nmol/L (ref 17–124)
Testosterone, Total, LC-MS-MS: 13 ng/dL (ref 2–45)

## 2020-05-24 ENCOUNTER — Telehealth (INDEPENDENT_AMBULATORY_CARE_PROVIDER_SITE_OTHER): Payer: Self-pay

## 2020-05-24 NOTE — Telephone Encounter (Signed)
Patient called and left a VM that she tested positive for Covid and wanted to know what to do.  I called the patient back and left a detailed voice message to advise for her to go to ER if she has any worsening symptoms of SOB, chest pain, etc. I also gave her the infusion center number in case she needed to contact them. I stated for patient to call back if she had more questions.

## 2020-06-07 ENCOUNTER — Other Ambulatory Visit: Payer: 59

## 2020-06-21 ENCOUNTER — Encounter (INDEPENDENT_AMBULATORY_CARE_PROVIDER_SITE_OTHER): Payer: Self-pay | Admitting: Internal Medicine

## 2020-06-21 ENCOUNTER — Ambulatory Visit (INDEPENDENT_AMBULATORY_CARE_PROVIDER_SITE_OTHER): Payer: 59 | Admitting: Internal Medicine

## 2020-06-21 ENCOUNTER — Other Ambulatory Visit: Payer: Self-pay

## 2020-06-21 VITALS — BP 110/64 | HR 75 | Temp 97.5°F | Resp 18 | Ht 62.0 in | Wt 128.2 lb

## 2020-06-21 DIAGNOSIS — E559 Vitamin D deficiency, unspecified: Secondary | ICD-10-CM

## 2020-06-21 DIAGNOSIS — R5383 Other fatigue: Secondary | ICD-10-CM | POA: Diagnosis not present

## 2020-06-21 DIAGNOSIS — F520 Hypoactive sexual desire disorder: Secondary | ICD-10-CM | POA: Diagnosis not present

## 2020-06-21 MED ORDER — NP THYROID 60 MG PO TABS: 60.0000 mg | ORAL_TABLET | Freq: Every day | ORAL | 3 refills | Status: DC

## 2020-06-21 MED ORDER — TESTOSTERONE 1.62 % TD GEL: 5.0000 mg | Freq: Every day | TRANSDERMAL | 1 refills | Status: DC

## 2020-06-21 NOTE — Progress Notes (Signed)
Metrics: Intervention Frequency ACO  Documented Smoking Status Yearly  Screened one or more times in 24 months  Cessation Counseling or  Active cessation medication Past 24 months  Past 24 months   Guideline developer: UpToDate (See UpToDate for funding source) Date Released: 2014       Wellness Office Visit  Subjective:  Patient ID: [name], female    DOB: [birth year]  Age: 45 y.o. MRN: [id number]  CC: This lady comes in to review all her blood work and further recommendations. HPI I reviewed all her symptoms with her and she confirmed that she feels very tired, focus and concentration is down, decreased libido which is causing marital issues and also describes a degree of cold intolerance. Her free T3 is suboptimal.  Vitamin D levels are suboptimal.  Testosterone levels are suboptimal.  Past Medical History:  Diagnosis Date  . Anxiety   . Cold sore   . Depression   . Seizures (HCC) 2017   Only 1 seizure   Past Surgical History:  Procedure Laterality Date  . CESAREAN SECTION  2006.2008   2  . OTHER SURGICAL HISTORY Right 2018   Radial Tunnel surgery  . PLACEMENT OF BREAST IMPLANTS Bilateral      Family History  Problem Relation Age of Onset  . Diabetes Father   . 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   Social History   Tobacco Use  . Smoking status: Former Smoker    Packs/day: 0.25    Years: 6.00    Pack years: 1.50    Types: Cigarettes  . Smokeless tobacco: Never Used  Substance Use Topics  . Alcohol use: No    Current Meds  Medication Sig  . AUROVELA FE 1.5/30 1.5-30 MG-MCG tablet Take 1 tablet by mouth daily.  . clobetasol cream (TEMOVATE) 0.05 % clobetasol 0.05 % topical cream  APPLY EXTERNALLY TO THE AFFECTED AREA THREE TIMES DAILY  . escitalopram (LEXAPRO) 10 MG tablet Take 1 tablet (10 mg total) by mouth daily.  . NP  THYROID 60 MG tablet Take 1 tablet (60 mg total) by mouth daily before breakfast.  . oxymetazoline (AFRIN) 0.05 % nasal spray Place 1 spray into both nostrils 2 (two) times daily as needed for congestion.  . Testosterone 1.62 % GEL Place 5 mg onto the skin daily.  . valACYclovir (VALTREX) 500 MG tablet Take 500 mg by mouth daily.      Depression screen [facility] 2/9 04/07/2020 07/23/2019 11/12/2017  Decreased Interest 3 1 0  Down, Depressed, Hopeless 2 1 0  PHQ - 2 Score 5 2 0  Altered sleeping 3 2 -  Tired, decreased energy 3 0 -  Change in appetite 2 0 -  Feeling bad or failure about yourself  3 1 -  Trouble concentrating 2 3 -  Moving slowly or fidgety/restless 2 0 -  Suicidal thoughts 1 0 -  PHQ-9 Score 21 8 -  Difficult doing work/chores - Not difficult at all -     Objective:   Today's Vitals: BP 110/64 (BP Location: Right Arm, Patient Position: Sitting, Cuff Size: Normal)   Pulse 75   Temp (!) 97.5 F (36.4 C) (Temporal)   Resp 18   Ht 5\' 2"  (1.575 m)   Wt 128 lb 3.2 oz (58.2 kg)   SpO2 99%   BMI 23.45 kg/m  Vitals with BMI 06/21/2020  05/17/2020 04/07/2020  Height 5\' 2"  5\' 2"  5\' 3"   Weight 128 lbs 3 oz 123 lbs 13 oz 125 lbs 6 oz  BMI 23.44 22.64 22.22  Systolic 110 108  Diastolic 64 78 70  Pulse 75 70 82     Physical Exam  She looks systemically well.  Healthy weight and BMI.  Blood pressure is excellent.     Assessment   1. Hypoactive sexual desire disorder   2. Vitamin D deficiency disease   3. Fatigue, unspecified type       Tests ordered No orders of the defined types were placed in this encounter.    Plan: 1. I recommended she start taking vitamin D3 10,000 units daily. 2. I do believe that she has symptoms of thyroid hypofunction despite "normal" tests.  I had a long discussion regarding thyroid function tests and relation to symptoms.  After shared decision making, we agreed that she will start desiccated NP thyroid, off label, for symptoms  of thyroid hypofunction. 3. We also discussed hypoactive sexual desire disorder and clearly testosterone levels are suboptimal.  I discussed data regarding trials/studies in women and testosterone and benefits and also explained possible side effects.  She agreed to start testosterone cream and I have called this into apothecary 5 mg daily apply to the labia.  I explained possible side effects and how to deal with them. 4. Follow-up in about 6 weeks to see how she is doing.   Meds ordered this encounter  Medications  . NP THYROID 60 MG tablet    Sig: Take 1 tablet (60 mg total) by mouth daily before breakfast.    Dispense:  30 tablet    Refill:  3  . Testosterone 1.62 % GEL    Sig: Place 5 mg onto the skin daily.    Dispense:  30 g    Refill:  1    Lashika Erker , MD

## 2020-06-23 ENCOUNTER — Encounter (INDEPENDENT_AMBULATORY_CARE_PROVIDER_SITE_OTHER): Payer: Self-pay | Admitting: Internal Medicine

## 2020-06-29 ENCOUNTER — Telehealth (INDEPENDENT_AMBULATORY_CARE_PROVIDER_SITE_OTHER): Payer: Self-pay | Admitting: Licensed Clinical Social Worker

## 2020-06-29 NOTE — Telephone Encounter (Signed)
Left generic message encouraging call back 

## 2020-07-07 ENCOUNTER — Other Ambulatory Visit (INDEPENDENT_AMBULATORY_CARE_PROVIDER_SITE_OTHER): Payer: Self-pay | Admitting: Nurse Practitioner

## 2020-07-07 DIAGNOSIS — R5383 Other fatigue: Secondary | ICD-10-CM

## 2020-07-07 DIAGNOSIS — Z13220 Encounter for screening for lipoid disorders: Secondary | ICD-10-CM

## 2020-07-07 DIAGNOSIS — F331 Major depressive disorder, recurrent, moderate: Secondary | ICD-10-CM

## 2020-07-07 DIAGNOSIS — Z131 Encounter for screening for diabetes mellitus: Secondary | ICD-10-CM

## 2020-07-25 ENCOUNTER — Encounter (INDEPENDENT_AMBULATORY_CARE_PROVIDER_SITE_OTHER): Payer: Self-pay | Admitting: Internal Medicine

## 2020-08-01 ENCOUNTER — Ambulatory Visit (INDEPENDENT_AMBULATORY_CARE_PROVIDER_SITE_OTHER): Payer: 59 | Admitting: Internal Medicine

## 2020-08-01 ENCOUNTER — Encounter (INDEPENDENT_AMBULATORY_CARE_PROVIDER_SITE_OTHER): Payer: Self-pay | Admitting: Internal Medicine

## 2020-08-01 ENCOUNTER — Other Ambulatory Visit: Payer: Self-pay

## 2020-08-01 VITALS — BP 104/68 | HR 71 | Temp 97.9°F | Ht 62.0 in | Wt 128.0 lb

## 2020-08-01 DIAGNOSIS — F520 Hypoactive sexual desire disorder: Secondary | ICD-10-CM

## 2020-08-01 DIAGNOSIS — F418 Other specified anxiety disorders: Secondary | ICD-10-CM

## 2020-08-01 DIAGNOSIS — R5383 Other fatigue: Secondary | ICD-10-CM

## 2020-08-01 MED ORDER — NONFORMULARY OR COMPOUNDED ITEM: 5.0000 mg | 3 refills | Status: DC

## 2020-08-01 MED ORDER — NP THYROID 90 MG PO TABS: 90.0000 mg | ORAL_TABLET | Freq: Every day | ORAL | 3 refills | Status: DC

## 2020-08-01 MED ORDER — ESCITALOPRAM OXALATE 20 MG PO TABS: 20.0000 mg | ORAL_TABLET | Freq: Every day | ORAL | 3 refills | Status: DC

## 2020-08-01 NOTE — Progress Notes (Signed)
Metrics: Intervention Frequency ACO  Documented Smoking Status Yearly  Screened one or more times in 24 months  Cessation Counseling or  Active cessation medication Past 24 months  Past 24 months   Guideline developer: UpToDate (See UpToDate for funding source) Date Released: 2014       Wellness Office Visit  Subjective:  Patient ID: [name], female    DOB: [birth year]  Age: 45 y.o. MRN: [id number]  CC: This lady comes in for follow-up of hypoactive sexual desire disorder, depression, fatigue. HPI  Since starting her on desiccated NP thyroid and testosterone cream, she does feel somewhat better but recently had to increase her Lexapro because she her depressive symptoms are not improved and the higher dose did seem to help her.  On closer questioning, she thinks that her focus and concentration has improved and energy to some degree although she clearly is not where she would like to be.  Her libido has really not improved of any significance.  She wonders whether testosterone injections might work better for her. Her cold intolerance has not improved significantly yet. Past Medical History:  Diagnosis Date  . Anxiety   . Cold sore   . Depression   . Seizures (HCC) 2017   Only 1 seizure   Past Surgical History:  Procedure Laterality Date  . CESAREAN SECTION  2006.2008   2  . OTHER SURGICAL HISTORY Right 2018   Radial Tunnel surgery  . PLACEMENT OF BREAST IMPLANTS Bilateral      Family History  Problem Relation Age of Onset  . Diabetes Father   . 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   Social History   Tobacco Use  . Smoking status: Former Smoker    Packs/day: 0.25    Years: 6.00    Pack years: 1.50    Types: Cigarettes  . Smokeless tobacco: Never Used  Substance Use Topics  . Alcohol use: No    Current Meds  Medication Sig  . AUROVELA  FE 1.5/30 1.5-30 MG-MCG tablet Take 1 tablet by mouth daily.  . clobetasol cream (TEMOVATE) 0.05 % clobetasol 0.05 % topical cream  APPLY EXTERNALLY TO THE AFFECTED AREA THREE TIMES DAILY  . escitalopram (LEXAPRO) 20 MG tablet Take 1 tablet (20 mg total) by mouth daily.  . NONFORMULARY OR COMPOUNDED ITEM Inject 5 mg into the muscle every 7 (seven) days. Testosterone cypionate in olive  oil (25 mg/mL).  Dispense 5 mL vial.  . NP THYROID 60 MG tablet Take 1 tablet (60 mg total) by mouth daily before breakfast.  . NP THYROID 90 MG tablet Take 1 tablet (90 mg total) by mouth daily.  Marland Kitchen oxymetazoline (AFRIN) 0.05 % nasal spray Place 1 spray into both nostrils 2 (two) times daily as needed for congestion.  . Testosterone 1.62 % GEL Place 5 mg onto the skin daily.  . valACYclovir (VALTREX) 500 MG tablet Take 500 mg by mouth daily.  . [DISCONTINUED] escitalopram (LEXAPRO) 10 MG tablet TAKE 1 TABLET(10 MG) BY MOUTH DAILY (Patient taking differently: Take 20 mg by mouth daily.)     Flowsheet Row Office Visit from 08/01/2020 in [facility]  PHQ-9 Total Score 9      Objective:   Today's Vitals: BP 104/68   Pulse 71   Temp 97.9 F (36.6 C) (Temporal)   Ht 5\' 2"  (1.575 m)   Wt  128 lb (58.1 kg)   SpO2 94%   BMI 23.41 kg/m  Vitals with BMI 08/01/2020 06/21/2020 05/17/2020  Height 5\' 2"  5\' 2"  5\' 2"   Weight 128 lbs 128 lbs 3 oz 123 lbs 13 oz  BMI 23.41 23.44 22.64  Systolic 104 110  Diastolic 68 64 78  Pulse 71 75 70     Physical Exam   She looks systemically well.  Weight is stable.  Blood pressure is excellent.    Assessment   1. Hypoactive sexual desire disorder   2. Depression with anxiety   3. Fatigue, unspecified type       Tests ordered No orders of the defined types were placed in this encounter.    Plan: 1. In terms of her testosterone therapy, we will switch her to testosterone injections and I have called in a prescription to [facility] for  testosterone injectable 5 mg intramuscular once a week.  She will come back to the office once she receives the medication for education on administration. 2. We will increase dose of NP thyroid to 90 mg daily and I have sent a new prescription to reflect this change. 3. Have sent a new prescription for the higher dose of Lexapro 20 mg daily. 4. Follow-up in 2 months to see how she is doing and hopefully she has improved at this stage.   Meds ordered this encounter  Medications  . NP THYROID 90 MG tablet    Sig: Take 1 tablet (90 mg total) by mouth daily.    Dispense:  30 tablet    Refill:  3  . escitalopram (LEXAPRO) 20 MG tablet    Sig: Take 1 tablet (20 mg total) by mouth daily.    Dispense:  30 tablet    Refill:  3  . NONFORMULARY OR COMPOUNDED ITEM    Sig: Inject 5 mg into the muscle every 7 (seven) days. Testosterone cypionate in olive  oil (25 mg/mL).  Dispense 5 mL vial.    Dispense:  1 each    Refill:  3    Nimish , MD

## 2020-08-10 ENCOUNTER — Other Ambulatory Visit: Payer: Self-pay

## 2020-08-10 ENCOUNTER — Ambulatory Visit (INDEPENDENT_AMBULATORY_CARE_PROVIDER_SITE_OTHER): Payer: 59

## 2020-08-10 DIAGNOSIS — F520 Hypoactive sexual desire disorder: Secondary | ICD-10-CM

## 2020-08-10 MED ORDER — TESTOSTERONE CYPIONATE 200 MG/ML IM SOLN: 200.0000 mg | Freq: Once | INTRAMUSCULAR | Status: DC

## 2020-08-10 NOTE — Progress Notes (Unsigned)
Patient came in today and supplied her own testosterone including her syringes and needles. Patient gave her testosterone 5 mg and gave 0.65mL injection in her right anterior thigh after going over injection education. Patient tolerated the injection well.

## 2020-10-03 ENCOUNTER — Ambulatory Visit (INDEPENDENT_AMBULATORY_CARE_PROVIDER_SITE_OTHER): Payer: 59 | Admitting: Internal Medicine

## 2020-11-07 ENCOUNTER — Other Ambulatory Visit (INDEPENDENT_AMBULATORY_CARE_PROVIDER_SITE_OTHER): Payer: Self-pay | Admitting: Internal Medicine

## 2021-02-13 ENCOUNTER — Ambulatory Visit (HOSPITAL_COMMUNITY): Payer: 59 | Admitting: Psychiatry

## 2021-03-17 ENCOUNTER — Telehealth: Payer: 59 | Admitting: Physician Assistant

## 2021-03-17 DIAGNOSIS — J111 Influenza due to unidentified influenza virus with other respiratory manifestations: Secondary | ICD-10-CM | POA: Diagnosis not present

## 2021-03-17 MED ORDER — OSELTAMIVIR PHOSPHATE 75 MG PO CAPS: 75.0000 mg | ORAL_CAPSULE | Freq: Two times a day (BID) | ORAL | 0 refills | Status: DC

## 2021-03-17 NOTE — Progress Notes (Signed)
E visit for Flu like symptoms   We are sorry that you are not feeling well.  Here is how we plan to help! Based on what you have shared with me it looks like you may have a respiratory virus that may be influenza.  Influenza or "the flu" is   an infection caused by a respiratory virus. The flu virus is highly contagious and persons who did not receive their yearly flu vaccination may "catch" the flu from close contact.  We have anti-viral medications to treat the viruses that cause this infection. They are not a "cure" and only shorten the course of the infection. These prescriptions are most effective when they are given within the first 2 days of "flu" symptoms. Antiviral medication are indicated if you have a high risk of complications from the flu. You should  also consider an antiviral medication if you are in close contact with someone who is at risk. These medications can help patients avoid complications from the flu  but have side effects that you should know. Possible side effects from Tamiflu or oseltamivir include nausea, vomiting, diarrhea, dizziness, headaches, eye redness, sleep problems or other respiratory symptoms. You should not take Tamiflu if you have an allergy to oseltamivir or any to the ingredients in Tamiflu.  Based upon your symptoms and potential risk factors I have prescribed Oseltamivir (Tamiflu).  It has been sent to your designated pharmacy.  You will take one 75 mg capsule orally twice a day for the next 5 days.  ANYONE WHO HAS FLU SYMPTOMS SHOULD: Stay home. The flu is highly contagious and going out or to work exposes others! Be sure to drink plenty of fluids. Water is fine as well as fruit juices, sodas and electrolyte beverages. You may want to stay away from caffeine or alcohol. If you are nauseated, try taking small sips of liquids. How do you know if you are getting enough fluid? Your urine should be a pale yellow or almost colorless. Get rest. Taking a steamy  shower or using a humidifier may help nasal congestion and ease sore throat pain. Using a saline nasal spray works much the same way. Cough drops, hard candies and sore throat lozenges may ease your cough. Line up a caregiver. Have someone check on you regularly.   GET HELP RIGHT AWAY IF: You cannot keep down liquids or your medications. You become short of breath Your fell like you are going to pass out or loose consciousness. Your symptoms persist after you have completed your treatment plan MAKE SURE YOU  Understand these instructions. Will watch your condition. Will get help right away if you are not doing well or get worse.  Your e-visit answers were reviewed by a board certified advanced clinical practitioner to complete your personal care plan.  Depending on the condition, your plan could have included both over the counter or prescription medications.  If there is a problem please reply  once you have received a response from your provider.  Your safety is important to us.  If you have drug allergies check your prescription carefully.    You can use MyChart to ask questions about today's visit, request a non-urgent call back, or ask for a work or school excuse for 24 hours related to this e-Visit. If it has been greater than 24 hours you will need to follow up with your provider, or enter a new e-Visit to address those concerns.  You will get an e-mail in the next   two days asking about your experience.  I hope that your e-visit has been valuable and will speed your recovery. Thank you for using e-visits.  I provided 5 minutes of non face-to-face time during this encounter for chart review and documentation.   

## 2021-03-20 ENCOUNTER — Telehealth: Payer: 59 | Admitting: Physician Assistant

## 2021-03-20 ENCOUNTER — Ambulatory Visit (HOSPITAL_COMMUNITY): Payer: 59

## 2021-03-20 DIAGNOSIS — T7840XA Allergy, unspecified, initial encounter: Secondary | ICD-10-CM | POA: Diagnosis not present

## 2021-03-20 NOTE — Patient Instructions (Signed)
[name], thank you for joining Mar Daring, PA-C for today's virtual visit.  While this provider is not your primary care provider (PCP), if your PCP is located in our provider database this encounter information will be shared with them immediately following your visit.  Consent: (Patient) [name] provided verbal consent for this virtual visit at the beginning of the encounter.  Current Medications:  Current Outpatient Medications:    AUROVELA FE 1.5/30 1.5-30 MG-MCG tablet, Take 1 tablet by mouth daily., Disp: , Rfl:    clobetasol cream (TEMOVATE) 0.05 %, clobetasol 0.05 % topical cream  APPLY EXTERNALLY TO THE AFFECTED AREA THREE TIMES DAILY, Disp: , Rfl:    escitalopram (LEXAPRO) 20 MG tablet, Take 1 tablet (20 mg total) by mouth daily., Disp: 30 tablet, Rfl: 3   NONFORMULARY OR COMPOUNDED ITEM, Inject 5 mg into the muscle every 7 (seven) days. Testosterone cypionate in olive  oil (25 mg/mL).  Dispense 5 mL vial., Disp: 1 each, Rfl: 3   NP THYROID 60 MG tablet, Take 1 tablet (60 mg total) by mouth daily before breakfast., Disp: 30 tablet, Rfl: 3   NP THYROID 90 MG tablet, Take 1 tablet (90 mg total) by mouth daily., Disp: 30 tablet, Rfl: 3   oseltamivir (TAMIFLU) 75 MG capsule, Take 1 capsule (75 mg total) by mouth 2 (two) times daily., Disp: 10 capsule, Rfl: 0   oxymetazoline (AFRIN) 0.05 % nasal spray, Place 1 spray into both nostrils 2 (two) times daily as needed for congestion., Disp: , Rfl:    Testosterone 1.62 % GEL, Place 5 mg onto the skin daily., Disp: 30 g, Rfl: 1   valACYclovir (VALTREX) 500 MG tablet, Take 500 mg by mouth daily., Disp: , Rfl:   Current Facility-Administered Medications:    testosterone cypionate (DEPOTESTOSTERONE CYPIONATE) injection 200 mg, 200 mg, Intramuscular, Once, Gosrani, Nimish C, MD   Medications ordered in this encounter:  No orders of the defined types were placed in this encounter.    *If you need refills  on other medications prior to your next appointment, please contact your pharmacy*  Follow-Up: Call back or seek an in-person evaluation if the symptoms worsen or if the condition fails to improve as anticipated.  Other Instructions Drug Allergy A drug allergy is when your body reacts in a bad way to a medicine. The reaction may be mild or very bad. In some cases, it can be life-threatening. If you have an allergic reaction, get help right away. You should get help even if the reaction seems mild. What are the causes? This condition is caused by a reaction in your body's defense system (immune system). The system sees a medicine as being harmful when it is not. What are the signs or symptoms? Symptoms of a mild reaction A stuffy nose (nasal congestion). Tingling in your mouth. An itchy, red rash. Symptoms of a very bad reaction Swelling of your eyes, lips, face, or tongue. Swelling of the back of your mouth and your throat. Breathing loudly (wheezing). A hoarse voice. Itchy, red, swollen areas of skin (hives). Feeling dizzy or light-headed. Passing out (fainting). Feeling worried or nervous (anxiety). Feeling mixed up (confused). Pain in your belly (abdomen). Trouble with breathing, talking, or swallowing. A tight feeling in your chest. Fast or uneven heartbeats (palpitations). Throwing up (vomiting). Watery poop (diarrhea). How is this treated? There is no cure for allergies. An allergic reaction can be treated with: Medicines to help your symptoms. Medicines that you  breathe into your lungs (respiratory inhalers). An allergy shot (epinephrine injection). For a very bad reaction, you may need to stay in the hospital. Your doctor may teach you how to use an allergy kit (anaphylaxis kit) and how to give yourself an allergy shot. You can give yourself an allergy shot with what is called an auto-injector "pen." Follow these instructions at home: If you have a very bad  allergy:  Always keep an auto-injector pen or your allergy kit with you. These could save your life. Use them as told by your doctor. Make sure that you, the people who live with you, and your employer know: How to use your allergy kit. How to use an auto-injector pen to give you an allergy shot. If you used your auto-injector pen: Get more medicine for it right away. This is important in case you have another reaction. Get help right away. Wear a medical alert bracelet or necklace that says you have an allergy, if your doctor tells you to do this. General instructions Avoid medicines that you are allergic to. Take over-the-counter and prescription medicines only as told by your doctor. Do not drive until your doctor says it is safe. If you have itchy, red, swollen areas of skin or a rash: Use an over-the-counter medicine (antihistamine) as told by your doctor. Put cold, wet cloths (cold compresses) on your skin. Take baths or showers in cool water. Avoid hot water. If you had tests done, it is up to you to get your test results. Ask your doctor when your results will be ready. Tell any doctors who care for you that you have a drug allergy. Keep all follow-up visits as told by your doctor. This is important. Contact a doctor if: You think that you are having a mild allergic reaction. You have symptoms that last more than 2 days after your reaction. Your symptoms get worse. You get new symptoms. Get help right away if: You had to use your auto-injector pen. You must go to the emergency room, even if the medicine seems to be working. You have symptoms of a very bad allergic reaction. These symptoms may be an emergency. Do not wait to see if the symptoms will go away. Use your auto-injector pen or allergy kit as you have been told. Get medical help right away. Call your local emergency services (911 in the [place] Do not drive yourself to the hospital. Summary A drug allergy is when your  body reacts in a bad way to a medicine. Take medicines only as told by your doctor. Tell any doctors who care for you that you have a drug allergy. Always keep an auto-injector pen or your allergy kit with you if you have a very bad allergy. This information is not intended to replace advice given to you by your health care provider. Make sure you discuss any questions you have with your health care provider. Document Revised: 07/22/2020 Document Reviewed: 11/27/2017 Elsevier Patient Education  2022 [employer].    If you have been instructed to have an in-person evaluation today at a local Urgent Care facility, please use the link below. It will take you to a list of all of our available [facility], including address, phone number and hours of operation. Please do not delay care.  [facility]  If you or a family member do not have a primary care provider, use the link below to schedule a visit and establish care. When you choose  a [facility] primary care physician or advanced practice provider, you gain a long-term partner in health. Find a Primary Care Provider  Learn more about [facility]'s in-office and virtual care options: [facility]

## 2021-03-20 NOTE — Progress Notes (Signed)
Virtual Visit Consent   [facility] P[facility], you are scheduled for a virtual visit with a [facility] provider today.     Just as with appointments in the office, your consent must be obtained to participate.  Your consent will be active for this visit and any virtual visit you may have with one of our providers in the next 365 days.     If you have a MyChart account, a copy of this consent can be sent to you electronically.  All virtual visits are billed to your insurance company just like a traditional visit in the office.    As this is a virtual visit, video technology does not allow for your provider to perform a traditional examination.  This may limit your provider's ability to fully assess your condition.  If your provider identifies any concerns that need to be evaluated in person or the need to arrange testing (such as labs, EKG, etc.), we will make arrangements to do so.     Although advances in technology are sophisticated, we cannot ensure that it will always work on either your end or our end.  If the connection with a video visit is poor, the visit may have to be switched to a telephone visit.  With either a video or telephone visit, we are not always able to ensure that we have a secure connection.     I need to obtain your verbal consent now.   Are you willing to proceed with your visit today?    [name] has provided verbal consent on 03/20/2021 for a virtual visit (video or telephone).   Margaretann Loveless, PA-C   Date: 03/20/2021 11:54 AM   Virtual Visit via Video Note   I, Margaretann Loveless, connected with  [facility]  ([id number], December 28, 1975) on 03/20/21 at 11:15 AM EDT by a video-enabled telemedicine application and verified that I am speaking with the correct person using two identifiers.  Location: Patient: Virtual Visit Location Patient: Home Provider: Virtual Visit Location Provider: Home Office   I discussed the limitations of  evaluation and management by telemedicine and the availability of in person appointments. The patient expressed understanding and agreed to proceed.    History of Present Illness: [name] is a 45 y.o. who identifies as a female who was assigned female at birth, and is being seen today for possible allergic reaction to Tamiflu. Has been on Tamiflu for 3-4 days. Now having rash on chest, itching. Did have worsening sore throat yesterday, but improved today. Mentions possible white spots on throat but no redness or swelling appreciated on self exam. No fevers. Still having dry cough.   Problems:  Patient Active Problem List   Diagnosis Date Noted   Seizure disorder (HCC) 04/05/2015   Depression with anxiety 02/17/2015    Allergies:  Allergies  Allergen Reactions   Penicillins Hives    Has patient had a PCN reaction causing immediate rash, facial/tongue/throat swelling, SOB or lightheadedness with hypotension: Yes Has patient had a PCN reaction causing severe rash involving mucus membranes or skin necrosis: No Has patient had a PCN reaction that required hospitalization No Has patient had a PCN reaction occurring within the last 10 years: No If all of the above answers are "NO", then may proceed with Cephalosporin use.    Wellbutrin [Bupropion] Other (See Comments)    Seizure   Tamiflu [Oseltamivir] Rash   Medications:  Current Outpatient Medications:    AUROVELA FE 1.5/30  1.5-30 MG-MCG tablet, Take 1 tablet by mouth daily., Disp: , Rfl:    clobetasol cream (TEMOVATE) 0.05 %, clobetasol 0.05 % topical cream  APPLY EXTERNALLY TO THE AFFECTED AREA THREE TIMES DAILY, Disp: , Rfl:    escitalopram (LEXAPRO) 20 MG tablet, Take 1 tablet (20 mg total) by mouth daily., Disp: 30 tablet, Rfl: 3   NONFORMULARY OR COMPOUNDED ITEM, Inject 5 mg into the muscle every 7 (seven) days. Testosterone cypionate in olive  oil (25 mg/mL).  Dispense 5 mL vial., Disp: 1 each, Rfl: 3   NP THYROID 60  MG tablet, Take 1 tablet (60 mg total) by mouth daily before breakfast., Disp: 30 tablet, Rfl: 3   NP THYROID 90 MG tablet, Take 1 tablet (90 mg total) by mouth daily., Disp: 30 tablet, Rfl: 3   oseltamivir (TAMIFLU) 75 MG capsule, Take 1 capsule (75 mg total) by mouth 2 (two) times daily., Disp: 10 capsule, Rfl: 0   oxymetazoline (AFRIN) 0.05 % nasal spray, Place 1 spray into both nostrils 2 (two) times daily as needed for congestion., Disp: , Rfl:    Testosterone 1.62 % GEL, Place 5 mg onto the skin daily., Disp: 30 g, Rfl: 1   valACYclovir (VALTREX) 500 MG tablet, Take 500 mg by mouth daily., Disp: , Rfl:   Current Facility-Administered Medications:    testosterone cypionate (DEPOTESTOSTERONE CYPIONATE) injection 200 mg, 200 mg, Intramuscular, Once, Gosrani, Nimish C, MD  Observations/Objective: Patient is well-developed, well-nourished in no acute distress.  Resting comfortably at home.  Head is normocephalic, atraumatic.  No labored breathing.  Speech is clear and coherent with logical content.  Patient is alert and oriented at baseline.    Assessment and Plan: 1. Allergic reaction to drug, initial encounter  - Suspect Tamiflu allergy - Advised to stop tamiflu and may take benadryl 25-50mg  as needed for itching and rash - Push fluids - Suspect white spots on throat may be salivary stones from dehydration since no associated redness, swelling or fever (strep suspicion low) - Rest as needed - Reach back out if symptoms change, fail to improve or worsen  Follow Up Instructions: I discussed the assessment and treatment plan with the patient. The patient was provided an opportunity to ask questions and all were answered. The patient agreed with the plan and demonstrated an understanding of the instructions.  A copy of instructions were sent to the patient via MyChart unless otherwise noted below.   The patient was advised to call back or seek an in-person evaluation if the symptoms  worsen or if the condition fails to improve as anticipated.  Time:  I spent 12 minutes with the patient via telehealth technology discussing the above problems/concerns.    Margaretann Loveless, PA-C

## 2021-07-21 ENCOUNTER — Encounter: Payer: Self-pay | Admitting: Family Medicine

## 2021-07-21 ENCOUNTER — Ambulatory Visit: Payer: 59 | Admitting: Family Medicine

## 2021-07-21 ENCOUNTER — Other Ambulatory Visit: Payer: Self-pay

## 2021-07-21 DIAGNOSIS — F418 Other specified anxiety disorders: Secondary | ICD-10-CM | POA: Diagnosis not present

## 2021-07-21 DIAGNOSIS — Z87898 Personal history of other specified conditions: Secondary | ICD-10-CM | POA: Insufficient documentation

## 2021-07-21 MED ORDER — ESCITALOPRAM OXALATE 20 MG PO TABS: 20.0000 mg | ORAL_TABLET | Freq: Every day | ORAL | 1 refills | Status: DC

## 2021-07-21 MED ORDER — ALPRAZOLAM 0.25 MG PO TABS: 0.2500 mg | ORAL_TABLET | Freq: Every day | ORAL | 0 refills | Status: DC | PRN

## 2021-07-21 NOTE — Patient Instructions (Signed)
Medications as prescribed.  Follow up in 6 months.  Take care  Dr. Devany Aja  

## 2021-07-21 NOTE — Progress Notes (Signed)
Subjective:  Patient ID: [name], female    DOB: 30-Jan-1976  Age: 46 y.o. MRN: [id number]  CC: Chief Complaint  Patient presents with   anxiety/ depression    Previously on alprazolam , needs refill    HPI:  46 year old female presents for evaluation of the above.  Patient suffers from depression and anxiety.  Worsening as of late.  She states that has been worsening over the past 6 months.  She attributes this to to stress and job change.  She is currently on Lexapro.  She states that she has previously been on alprazolam and would like to restart this.  PHQ-9 score of 18.  GAD-7 score of 18 as well.  Patient Active Problem List   Diagnosis Date Noted   History of seizure 07/21/2021   Depression with anxiety 02/17/2015    Social Hx   Social History   Socioeconomic History   Marital status: Married    Spouse name: Not on file   Number of children: Not on file   Years of education: Not on file   Highest education level: Not on file  Occupational History    Employer: [employer]    Comment: [employer]; [employer]  Tobacco Use   Smoking status: Former    Packs/day: 0.25    Years: 6.00    Pack years: 1.50    Types: Cigarettes   Smokeless tobacco: Never  Vaping Use   Vaping Use: Never used  Substance and Sexual Activity   Alcohol use: No   Drug use: No   Sexual activity: Not on file  Other Topics Concern   Not on file  Social History Narrative   Married for 15 years.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   Social Determinants of Health   Financial Resource Strain: Not on file  Food Insecurity: Not on file  Transportation Needs: Not on file  Physical Activity: Not on file  Stress: Not on file  Social Connections: Not on file    Review of Systems Per HPI  Objective:  BP 108/68    Pulse (!) 101    Temp 98.1 F (36.7 C)    Ht 5\' 2"  (1.575 m)    Wt 131 lb (59.4 kg)    SpO2 100%    BMI 23.96 kg/m   BP/Weight 07/21/2021  08/01/2020 06/21/2020  Systolic BP 108 104 110  Diastolic BP 68 68 64  Wt. (Lbs) 131 128 128.2  BMI 23.96 23.41 23.45    Physical Exam Vitals and nursing note reviewed.  Constitutional:      General: She is not in acute distress.    Appearance: Normal appearance. She is not ill-appearing.  HENT:     Head: Normocephalic and atraumatic.  Eyes:     General:        Right eye: No discharge.        Left eye: No discharge.     Conjunctiva/sclera: Conjunctivae normal.  Cardiovascular:     Rate and Rhythm: Normal rate and regular rhythm.  Pulmonary:     Effort: Pulmonary effort is normal.     Breath sounds: Normal breath sounds. No wheezing, rhonchi or rales.  Neurological:     Mental Status: She is alert.  Psychiatric:        Mood and Affect: Mood normal.        Behavior: Behavior normal.    Lab Results  Component Value Date   WBC 8.8 05/17/2020   HGB 12.0 05/17/2020  HCT 36.3 05/17/2020   PLT 307 05/17/2020   GLUCOSE 81 05/17/2020   CHOL 161 05/17/2020   TRIG 113 05/17/2020   HDL 53 05/17/2020   LDLCALC 87 05/17/2020   ALT 12 05/17/2020   AST 21 05/17/2020   NA 138 05/17/2020   K 4.1 05/17/2020   CL 105 05/17/2020   CREATININE 0.78 05/17/2020   BUN 19 05/17/2020   CO2 27 05/17/2020   TSH 3.25 05/17/2020   HGBA1C 5.0 05/17/2020     Assessment & Plan:   Problem List Items Addressed This Visit       Other   Depression with anxiety    Chronic, worsening.  Particular worsening anxiety.  Continue Lexapro.  Starting Xanax.      Relevant Medications   escitalopram (LEXAPRO) 20 MG tablet   ALPRAZolam (XANAX) 0.25 MG tablet    Meds ordered this encounter  Medications   escitalopram (LEXAPRO) 20 MG tablet    Sig: Take 1 tablet (20 mg total) by mouth daily.    Dispense:  90 tablet    Refill:  1   ALPRAZolam (XANAX) 0.25 MG tablet    Sig: Take 1 tablet (0.25 mg total) by mouth daily as needed for anxiety.    Dispense:  30 tablet    Refill:  0    Follow-up:   Return in about 6 months (around 01/18/2022).  Everlene Other DO [facility]

## 2021-07-21 NOTE — Assessment & Plan Note (Signed)
Chronic, worsening.  Particular worsening anxiety.  Continue Lexapro.  Starting Xanax.

## 2021-08-16 ENCOUNTER — Other Ambulatory Visit: Payer: Self-pay | Admitting: Family Medicine

## 2021-08-16 ENCOUNTER — Telehealth: Payer: Self-pay | Admitting: Family Medicine

## 2021-08-16 DIAGNOSIS — F418 Other specified anxiety disorders: Secondary | ICD-10-CM

## 2021-08-16 NOTE — Telephone Encounter (Signed)
Pt replied via MyChart  

## 2021-08-16 NOTE — Telephone Encounter (Signed)
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 Wanting to know if pt can get a referral to psychiatry for depression/anxiety. Pt med has recently been changed and husband is not sure if it is working anymore or if she needs to be on it longer. Please advise. Thank you ? ?[phone] ?

## 2021-08-16 NOTE — Telephone Encounter (Signed)
No voicemail set up at this time. Husband is not on DPR.  ?

## 2021-08-16 NOTE — Telephone Encounter (Signed)
Sent mychart message to patient

## 2021-09-02 ENCOUNTER — Other Ambulatory Visit: Payer: Self-pay | Admitting: Family Medicine

## 2021-10-26 ENCOUNTER — Other Ambulatory Visit: Payer: Self-pay | Admitting: Family Medicine

## 2021-12-21 ENCOUNTER — Ambulatory Visit: Payer: 59 | Admitting: Family Medicine

## 2022-03-07 ENCOUNTER — Other Ambulatory Visit: Payer: Self-pay | Admitting: Obstetrics and Gynecology

## 2022-03-07 DIAGNOSIS — R928 Other abnormal and inconclusive findings on diagnostic imaging of breast: Secondary | ICD-10-CM

## 2022-03-08 ENCOUNTER — Other Ambulatory Visit: Payer: Self-pay

## 2022-03-08 MED ORDER — ALPRAZOLAM 0.25 MG PO TABS: ORAL_TABLET | ORAL | 3 refills | Status: DC

## 2022-03-20 LAB — HM PAP SMEAR: HM Pap smear: NORMAL

## 2022-03-28 ENCOUNTER — Ambulatory Visit
Admission: RE | Admit: 2022-03-28 | Discharge: 2022-03-28 | Disposition: A | Discharge status: Home / Self Care | Payer: BC Managed Care – PPO | Source: Ambulatory Visit | Attending: Obstetrics and Gynecology | Admitting: Obstetrics and Gynecology

## 2022-03-28 ENCOUNTER — Ambulatory Visit
Admission: RE | Admit: 2022-03-28 | Discharge: 2022-03-28 | Disposition: A | Discharge status: Home / Self Care | Payer: Self-pay | Source: Ambulatory Visit | Attending: Obstetrics and Gynecology | Admitting: Obstetrics and Gynecology

## 2022-03-28 DIAGNOSIS — R928 Other abnormal and inconclusive findings on diagnostic imaging of breast: Secondary | ICD-10-CM

## 2022-05-25 ENCOUNTER — Ambulatory Visit: Admit: 2022-05-25 | Discharge status: Absent without leave | Payer: BC Managed Care – PPO

## 2022-05-28 ENCOUNTER — Telehealth: Payer: BC Managed Care – PPO | Admitting: Family Medicine

## 2022-05-28 DIAGNOSIS — J069 Acute upper respiratory infection, unspecified: Secondary | ICD-10-CM

## 2022-05-28 MED ORDER — FLUTICASONE PROPIONATE 50 MCG/ACT NA SUSP: 2.0000 | Freq: Every day | NASAL | 0 refills | Status: DC

## 2022-05-28 NOTE — Patient Instructions (Signed)
Aamari Speights Colo, thank you for joining Perlie Mayo, NP for today's virtual visit.  While this provider is not your primary care provider (PCP), if your PCP is located in our provider database this encounter information will be shared with them immediately following your visit.   [facility] account gives you access to today's visit and all your visits, tests, and labs performed at [facility] " click here if you don't have a [facility] account or go to [email or web address]  Consent: (Patient) [name] provided verbal consent for this virtual visit at the beginning of the encounter.  Current Medications:  Current Outpatient Medications:    fluticasone (FLONASE) 50 MCG/ACT nasal spray, Place 2 sprays into both nostrils daily., Disp: 16 g, Rfl: 0   ALPRAZolam (XANAX) 0.25 MG tablet, TAKE 1 TABLET(0.25 MG) BY MOUTH DAILY AS NEEDED FOR ANXIETY, Disp: 30 tablet, Rfl: 3   AUROVELA FE 1.5/30 1.5-30 MG-MCG tablet, Take 1 tablet by mouth daily., Disp: , Rfl:    escitalopram (LEXAPRO) 20 MG tablet, TAKE 1 TABLET(20 MG) BY MOUTH DAILY, Disp: 90 tablet, Rfl: 1   oxymetazoline (AFRIN) 0.05 % nasal spray, Place 1 spray into both nostrils 2 (two) times daily as needed for congestion., Disp: , Rfl:    valACYclovir (VALTREX) 500 MG tablet, Take 500 mg by mouth daily., Disp: , Rfl:    Medications ordered in this encounter:  Meds ordered this encounter  Medications   fluticasone (FLONASE) 50 MCG/ACT nasal spray    Sig: Place 2 sprays into both nostrils daily.    Dispense:  16 g    Refill:  0    Order Specific Question:   Supervising Provider    Answer:   Chase Picket [id number]     *If you need refills on other medications prior to your next appointment, please contact your pharmacy*  Follow-Up: Call back or seek an in-person evaluation if the symptoms worsen or if the condition fails to improve as anticipated.  [facility]  [phone]  Other Instructions  -Take meds as prescribed -Rest -Use a cool mist humidifier especially during the winter months when heat dries out the air. - Use saline nose sprays frequently to help soothe nasal passages and promote drainage. -Saline irrigations of the nose can be very helpful if done frequently.             * 4X daily for 1 week*             * Use of a nettie pot can be helpful with this.  *Follow directions with this* *Boiled or distilled water only -stay hydrated by drinking plenty of fluids - Keep thermostat turn down low to prevent drying out sinuses - For any cough or congestion- robitussin DM or Delsym as needed - For fever or aches or pains- take tylenol or ibuprofen as directed on bottle             * for fevers greater than 101 orally you may alternate ibuprofen and tylenol every 3 hours.  If you do not improve you will need a follow up visit in person.                  If you have been instructed to have an in-person evaluation today at a local Urgent Care facility, please use the link below. It will take you to a list of all of our available [facility], including address,  phone number and hours of operation. Please do not delay care.  [facility]  If you or a family member do not have a primary care provider, use the link below to schedule a visit and establish care. When you choose a [facility] primary care physician or advanced practice provider, you gain a long-term partner in health. Find a Primary Care Provider  Learn more about [facility]'s in-office and virtual care options: [facility]

## 2022-05-28 NOTE — Progress Notes (Signed)
Virtual Visit Consent   [facility], you are scheduled for a virtual visit with a [place] provider today. Just as with appointments in the office, your consent must be obtained to participate. Your consent will be active for this visit and any virtual visit you may have with one of our providers in the next 365 days. If you have a MyChart account, a copy of this consent can be sent to you electronically.  As this is a virtual visit, video technology does not allow for your provider to perform a traditional examination. This may limit your provider's ability to fully assess your condition. If your provider identifies any concerns that need to be evaluated in person or the need to arrange testing (such as labs, EKG, etc.), we will make arrangements to do so. Although advances in technology are sophisticated, we cannot ensure that it will always work on either your end or our end. If the connection with a video visit is poor, the visit may have to be switched to a telephone visit. With either a video or telephone visit, we are not always able to ensure that we have a secure connection.  By engaging in this virtual visit, you consent to the provision of healthcare and authorize for your insurance to be billed (if applicable) for the services provided during this visit. Depending on your insurance coverage, you may receive a charge related to this service.  I need to obtain your verbal consent now. Are you willing to proceed with your visit today? [name] has provided verbal consent on 05/28/2022 for a virtual visit (video or telephone). Martha Mayo, NP  Date: 05/28/2022 2:37 PM  Virtual Visit via Video Note   I, [name], connected with  [facility]  ([id number], 10/17/1975) on 05/28/22 at  2:45 PM EST by a video-enabled telemedicine application and verified that I am speaking with the correct person using two identifiers.  Location: Patient: Virtual  Visit Location Patient: Home Provider: Virtual Visit Location Provider: Home Office   I discussed the limitations of evaluation and management by telemedicine and the availability of in person appointments. The patient expressed understanding and agreed to proceed.    History of Present Illness: [name] is a 47 y.o. who identifies as a female who was assigned female at birth, and is being seen today for sinus infection pain- started with facial pain on Friday - cheeks and bridge of nose.   HPI: Sinusitis This is a new problem. The current episode started in the past 7 days. The problem has been waxing and waning since onset. There has been no fever. The pain is moderate. Associated symptoms include congestion, headaches and sinus pressure. Pertinent negatives include no chills, coughing, diaphoresis, ear pain, hoarse voice, neck pain, shortness of breath, sneezing, sore throat or swollen glands. Past treatments include acetaminophen. The treatment provided no relief.    Problems:  Patient Active Problem List   Diagnosis Date Noted   History of seizure 07/21/2021   Depression with anxiety 02/17/2015    Allergies:  Allergies  Allergen Reactions   Penicillins Hives    Has patient had a PCN reaction causing immediate rash, facial/tongue/throat swelling, SOB or lightheadedness with hypotension: Yes Has patient had a PCN reaction causing severe rash involving mucus membranes or skin necrosis: No Has patient had a PCN reaction that required hospitalization No Has patient had a PCN reaction occurring within the last 10 years: No If all of the  above answers are "NO", then may proceed with Cephalosporin use.    Wellbutrin [Bupropion] Other (See Comments)    Seizure   Tamiflu [Oseltamivir] Rash   Medications:  Current Outpatient Medications:    ALPRAZolam (XANAX) 0.25 MG tablet, TAKE 1 TABLET(0.25 MG) BY MOUTH DAILY AS NEEDED FOR ANXIETY, Disp: 30 tablet, Rfl: 3   AUROVELA FE  1.5/30 1.5-30 MG-MCG tablet, Take 1 tablet by mouth daily., Disp: , Rfl:    escitalopram (LEXAPRO) 20 MG tablet, TAKE 1 TABLET(20 MG) BY MOUTH DAILY, Disp: 90 tablet, Rfl: 1   oxymetazoline (AFRIN) 0.05 % nasal spray, Place 1 spray into both nostrils 2 (two) times daily as needed for congestion., Disp: , Rfl:    valACYclovir (VALTREX) 500 MG tablet, Take 500 mg by mouth daily., Disp: , Rfl:   Observations/Objective: Patient is well-developed, well-nourished in no acute distress.  Resting comfortably  at home.  Head is normocephalic, atraumatic.  No labored breathing.  Speech is clear and coherent with logical content.  Patient is alert and oriented at baseline.    Assessment and Plan:  1. Viral URI  - fluticasone (FLONASE) 50 MCG/ACT nasal spray; Place 2 sprays into both nostrils daily.  Dispense: 16 g; Refill: 0  -Take meds as prescribed -Rest -Use a cool mist humidifier especially during the winter months when heat dries out the air. - Use saline nose sprays frequently to help soothe nasal passages and promote drainage. -Saline irrigations of the nose can be very helpful if done frequently.             * 4X daily for 1 week*             * Use of a nettie pot can be helpful with this.  *Follow directions with this* *Boiled or distilled water only -stay hydrated by drinking plenty of fluids - Keep thermostat turn down low to prevent drying out sinuses - For any cough or congestion- robitussin DM or Delsym as needed - For fever or aches or pains- take tylenol or ibuprofen as directed on bottle             * for fevers greater than 101 orally you may alternate ibuprofen and tylenol every 3 hours.  If you do not improve you will need a follow up visit in person.               Reviewed side effects, risks and benefits of medication.    Patient acknowledged agreement and understanding of the plan.   Past Medical, Surgical, Social History, Allergies, and Medications have been  Reviewed.     Follow Up Instructions: I discussed the assessment and treatment plan with the patient. The patient was provided an opportunity to ask questions and all were answered. The patient agreed with the plan and demonstrated an understanding of the instructions.  A copy of instructions were sent to the patient via MyChart unless otherwise noted below.     The patient was advised to call back or seek an in-person evaluation if the symptoms worsen or if the condition fails to improve as anticipated.  Time:  I spent 10 minutes with the patient via telehealth technology discussing the above problems/concerns.    Martha Mayo, NP

## 2022-05-31 ENCOUNTER — Telehealth: Payer: BC Managed Care – PPO

## 2022-05-31 ENCOUNTER — Telehealth: Payer: BC Managed Care – PPO | Admitting: Physician Assistant

## 2022-05-31 DIAGNOSIS — J019 Acute sinusitis, unspecified: Secondary | ICD-10-CM | POA: Diagnosis not present

## 2022-05-31 DIAGNOSIS — B9689 Other specified bacterial agents as the cause of diseases classified elsewhere: Secondary | ICD-10-CM

## 2022-05-31 MED ORDER — DOXYCYCLINE HYCLATE 100 MG PO TABS: 100.0000 mg | ORAL_TABLET | Freq: Two times a day (BID) | ORAL | 0 refills | Status: DC

## 2022-05-31 NOTE — Progress Notes (Signed)
Virtual Visit Consent   [facility], you are scheduled for a virtual visit with a [facility] provider today. Just as with appointments in the office, your consent must be obtained to participate. Your consent will be active for this visit and any virtual visit you may have with one of our providers in the next 365 days. If you have a MyChart account, a copy of this consent can be sent to you electronically.  As this is a virtual visit, video technology does not allow for your provider to perform a traditional examination. This may limit your provider's ability to fully assess your condition. If your provider identifies any concerns that need to be evaluated in person or the need to arrange testing (such as labs, EKG, etc.), we will make arrangements to do so. Although advances in technology are sophisticated, we cannot ensure that it will always work on either your end or our end. If the connection with a video visit is poor, the visit may have to be switched to a telephone visit. With either a video or telephone visit, we are not always able to ensure that we have a secure connection.  By engaging in this virtual visit, you consent to the provision of healthcare and authorize for your insurance to be billed (if applicable) for the services provided during this visit. Depending on your insurance coverage, you may receive a charge related to this service.  I need to obtain your verbal consent now. Are you willing to proceed with your visit today? [name] has provided verbal consent on 05/31/2022 for a virtual visit (video or telephone). Mar Daring, PA-C  Date: 05/31/2022 8:59 AM  Virtual Visit via Video Note   I, Mar Daring, connected with  [name]  ([id number], January 16, 1976) on 05/31/22 at  9:00 AM EST by a video-enabled telemedicine application and verified that I am speaking with the correct person using two identifiers.  Location: Patient:  Virtual Visit Location Patient: Home Provider: Virtual Visit Location Provider: Home Office   I discussed the limitations of evaluation and management by telemedicine and the availability of in person appointments. The patient expressed understanding and agreed to proceed.    History of Present Illness: [name] is a 47 y.o. who identifies as a female who was assigned female at birth, and is being seen today for possible sinus infection.  HPI: Sinusitis This is a new problem. The current episode started in the past 7 days (Did EV on 05/28/22, started on Flonase for flu-like symtpoms). The problem has been gradually worsening since onset. There has been no fever. Associated symptoms include chills, congestion, ear pain (intermittent), headaches and sinus pressure. Pertinent negatives include no coughing, hoarse voice or sore throat. (Rhinorrhea and post nasal drainage) Treatments tried: flonase, sudafed, nyquil. The treatment provided no relief.     Problems:  Patient Active Problem List   Diagnosis Date Noted   History of seizure 07/21/2021   Depression with anxiety 02/17/2015    Allergies:  Allergies  Allergen Reactions   Penicillins Hives    Has patient had a PCN reaction causing immediate rash, facial/tongue/throat swelling, SOB or lightheadedness with hypotension: Yes Has patient had a PCN reaction causing severe rash involving mucus membranes or skin necrosis: No Has patient had a PCN reaction that required hospitalization No Has patient had a PCN reaction occurring within the last 10 years: No If all of the above answers are "NO", then may proceed with Cephalosporin  use.    Wellbutrin [Bupropion] Other (See Comments)    Seizure   Tamiflu [Oseltamivir] Rash   Medications:  Current Outpatient Medications:    doxycycline (VIBRA-TABS) 100 MG tablet, Take 1 tablet (100 mg total) by mouth 2 (two) times daily., Disp: 20 tablet, Rfl: 0   ALPRAZolam (XANAX) 0.25 MG  tablet, TAKE 1 TABLET(0.25 MG) BY MOUTH DAILY AS NEEDED FOR ANXIETY, Disp: 30 tablet, Rfl: 3   AUROVELA FE 1.5/30 1.5-30 MG-MCG tablet, Take 1 tablet by mouth daily., Disp: , Rfl:    escitalopram (LEXAPRO) 20 MG tablet, TAKE 1 TABLET(20 MG) BY MOUTH DAILY, Disp: 90 tablet, Rfl: 1   fluticasone (FLONASE) 50 MCG/ACT nasal spray, Place 2 sprays into both nostrils daily., Disp: 16 g, Rfl: 0   oxymetazoline (AFRIN) 0.05 % nasal spray, Place 1 spray into both nostrils 2 (two) times daily as needed for congestion., Disp: , Rfl:    valACYclovir (VALTREX) 500 MG tablet, Take 500 mg by mouth daily., Disp: , Rfl:   Observations/Objective: Patient is well-developed, well-nourished in no acute distress.  Resting comfortably at home.  Head is normocephalic, atraumatic.  No labored breathing.  Speech is clear and coherent with logical content.  Patient is alert and oriented at baseline.    Assessment and Plan: 1. Acute bacterial sinusitis - doxycycline (VIBRA-TABS) 100 MG tablet; Take 1 tablet (100 mg total) by mouth 2 (two) times daily.  Dispense: 20 tablet; Refill: 0  - Worsening symptoms that have not responded to OTC medications.  - Will give Doxycycline - Continue allergy medications.  - Steam and humidifier can help - Stay well hydrated and get plenty of rest.  - Seek in person evaluation if no symptom improvement or if symptoms worsen   Follow Up Instructions: I discussed the assessment and treatment plan with the patient. The patient was provided an opportunity to ask questions and all were answered. The patient agreed with the plan and demonstrated an understanding of the instructions.  A copy of instructions were sent to the patient via MyChart unless otherwise noted below.    The patient was advised to call back or seek an in-person evaluation if the symptoms worsen or if the condition fails to improve as anticipated.  Time:  I spent 8 minutes with the patient via telehealth technology  discussing the above problems/concerns.    Mar Daring, PA-C

## 2022-05-31 NOTE — Patient Instructions (Signed)
[name], thank you for joining Mar Daring, PA-C for today's virtual visit.  While this provider is not your primary care provider (PCP), if your PCP is located in our provider database this encounter information will be shared with them immediately following your visit.   [facility] account gives you access to today's visit and all your visits, tests, and labs performed at [facility] " click here if you don't have a [facility] account or go to [email or web address]  Consent: (Patient) [name] provided verbal consent for this virtual visit at the beginning of the encounter.  Current Medications:  Current Outpatient Medications:    doxycycline (VIBRA-TABS) 100 MG tablet, Take 1 tablet (100 mg total) by mouth 2 (two) times daily., Disp: 20 tablet, Rfl: 0   ALPRAZolam (XANAX) 0.25 MG tablet, TAKE 1 TABLET(0.25 MG) BY MOUTH DAILY AS NEEDED FOR ANXIETY, Disp: 30 tablet, Rfl: 3   AUROVELA FE 1.5/30 1.5-30 MG-MCG tablet, Take 1 tablet by mouth daily., Disp: , Rfl:    escitalopram (LEXAPRO) 20 MG tablet, TAKE 1 TABLET(20 MG) BY MOUTH DAILY, Disp: 90 tablet, Rfl: 1   fluticasone (FLONASE) 50 MCG/ACT nasal spray, Place 2 sprays into both nostrils daily., Disp: 16 g, Rfl: 0   oxymetazoline (AFRIN) 0.05 % nasal spray, Place 1 spray into both nostrils 2 (two) times daily as needed for congestion., Disp: , Rfl:    valACYclovir (VALTREX) 500 MG tablet, Take 500 mg by mouth daily., Disp: , Rfl:    Medications ordered in this encounter:  Meds ordered this encounter  Medications   doxycycline (VIBRA-TABS) 100 MG tablet    Sig: Take 1 tablet (100 mg total) by mouth 2 (two) times daily.    Dispense:  20 tablet    Refill:  0    Order Specific Question:   Supervising Provider    Answer:   Chase Picket [id number]     *If you need refills on other medications prior to your next appointment, please contact your  pharmacy*  Follow-Up: Call back or seek an in-person evaluation if the symptoms worsen or if the condition fails to improve as anticipated.  [place] [phone]  Other Instructions Sinus Infection, Adult A sinus infection, also called sinusitis, is inflammation of your sinuses. Sinuses are hollow spaces in the bones around your face. Your sinuses are located: Around your eyes. In the middle of your forehead. Behind your nose. In your cheekbones. Mucus normally drains out of your sinuses. When your nasal tissues become inflamed or swollen, mucus can become trapped or blocked. This allows bacteria, viruses, and fungi to grow, which leads to infection. Most infections of the sinuses are caused by a virus. A sinus infection can develop quickly. It can last for up to 4 weeks (acute) or for more than 12 weeks (chronic). A sinus infection often develops after a cold. What are the causes? This condition is caused by anything that creates swelling in the sinuses or stops mucus from draining. This includes: Allergies. Asthma. Infection from bacteria or viruses. Deformities or blockages in your nose or sinuses. Abnormal growths in the nose (nasal polyps). Pollutants, such as chemicals or irritants in the air. Infection from fungi. This is rare. What increases the risk? You are more likely to develop this condition if you: Have a weak body defense system (immune system). Do a lot of swimming or diving. Overuse nasal sprays. Smoke. What are the signs or symptoms? The  main symptoms of this condition are pain and a feeling of pressure around the affected sinuses. Other symptoms include: Stuffy nose or congestion that makes it difficult to breathe through your nose. Thick yellow or greenish drainage from your nose. Tenderness, swelling, and warmth over the affected sinuses. A cough that may get worse at night. Decreased sense of smell and taste. Extra mucus that collects in  the throat or the back of the nose (postnasal drip) causing a sore throat or bad breath. Tiredness (fatigue). Fever. How is this diagnosed? This condition is diagnosed based on: Your symptoms. Your medical history. A physical exam. Tests to find out if your condition is acute or chronic. This may include: Checking your nose for nasal polyps. Viewing your sinuses using a device that has a light (endoscope). Testing for allergies or bacteria. Imaging tests, such as an MRI or CT scan. In rare cases, a bone biopsy may be done to rule out more serious types of fungal sinus disease. How is this treated? Treatment for a sinus infection depends on the cause and whether your condition is chronic or acute. If caused by a virus, your symptoms should go away on their own within 10 days. You may be given medicines to relieve symptoms. They include: Medicines that shrink swollen nasal passages (decongestants). A spray that eases inflammation of the nostrils (topical intranasal corticosteroids). Rinses that help get rid of thick mucus in your nose (nasal saline washes). Medicines that treat allergies (antihistamines). Over-the-counter pain relievers. If caused by bacteria, your health care provider may recommend waiting to see if your symptoms improve. Most bacterial infections will get better without antibiotic medicine. You may be given antibiotics if you have: A severe infection. A weak immune system. If caused by narrow nasal passages or nasal polyps, surgery may be needed. Follow these instructions at home: Medicines Take, use, or apply over-the-counter and prescription medicines only as told by your health care provider. These may include nasal sprays. If you were prescribed an antibiotic medicine, take it as told by your health care provider. Do not stop taking the antibiotic even if you start to feel better. Hydrate and humidify  Drink enough fluid to keep your urine pale yellow. Staying  hydrated will help to thin your mucus. Use a cool mist humidifier to keep the humidity level in your home above 50%. Inhale steam for 10-15 minutes, 3-4 times a day, or as told by your health care provider. You can do this in the bathroom while a hot shower is running. Limit your exposure to cool or dry air. Rest Rest as much as possible. Sleep with your head raised (elevated). Make sure you get enough sleep each night. General instructions  Apply a warm, moist washcloth to your face 3-4 times a day or as told by your health care provider. This will help with discomfort. Use nasal saline washes as often as told by your health care provider. Wash your hands often with soap and water to reduce your exposure to germs. If soap and water are not available, use hand sanitizer. Do not smoke. Avoid being around people who are smoking (secondhand smoke). Keep all follow-up visits. This is important. Contact a health care provider if: You have a fever. Your symptoms get worse. Your symptoms do not improve within 10 days. Get help right away if: You have a severe headache. You have persistent vomiting. You have severe pain or swelling around your face or eyes. You have vision problems. You develop confusion.  Your neck is stiff. You have trouble breathing. These symptoms may be an emergency. Get help right away. Call 911. Do not wait to see if the symptoms will go away. Do not drive yourself to the hospital. Summary A sinus infection is soreness and inflammation of your sinuses. Sinuses are hollow spaces in the bones around your face. This condition is caused by nasal tissues that become inflamed or swollen. The swelling traps or blocks the flow of mucus. This allows bacteria, viruses, and fungi to grow, which leads to infection. If you were prescribed an antibiotic medicine, take it as told by your health care provider. Do not stop taking the antibiotic even if you start to feel better. Keep  all follow-up visits. This is important. This information is not intended to replace advice given to you by your health care provider. Make sure you discuss any questions you have with your health care provider. Document Revised: 04/18/2021 Document Reviewed: 04/18/2021 Elsevier Patient Education  [place].    If you have been instructed to have an in-person evaluation today at a local Urgent Care facility, please use the link below. It will take you to a list of all of our available [facility], including address, phone number and hours of operation. Please do not delay care.  [facility]  If you or a family member do not have a primary care provider, use the link below to schedule a visit and establish care. When you choose a [facility] primary care physician or advanced practice provider, you gain a long-term partner in health. Find a Primary Care Provider  Learn more about [facility]'s in-office and virtual care options: [facility]

## 2022-09-30 ENCOUNTER — Other Ambulatory Visit: Payer: Self-pay | Admitting: Family Medicine

## 2022-10-29 ENCOUNTER — Other Ambulatory Visit: Payer: Self-pay | Admitting: Family Medicine

## 2022-11-14 IMAGING — MG MM  DIGITAL DIAGNOSTIC BREAST BILAT IMPLANT W/ TOMO W/ CAD
8 of 12 series · 8 of 28 positions shown · non-contrast
Comparison: Previous exam(s).
COMPARISON: Previous exam(s).

Addendum:
CLINICAL DATA: Follow-up probably benign mass in the 3 o'clock
position of the left breast.

EXAM:
DIGITAL DIAGNOSTIC BILATERAL MAMMOGRAM WITH IMPLANTS AND TOMO
ULTRASOUND LEFT BREAST
The patient has retropectoral implants. Standard and implant
displaced views were performed.

[R CC]
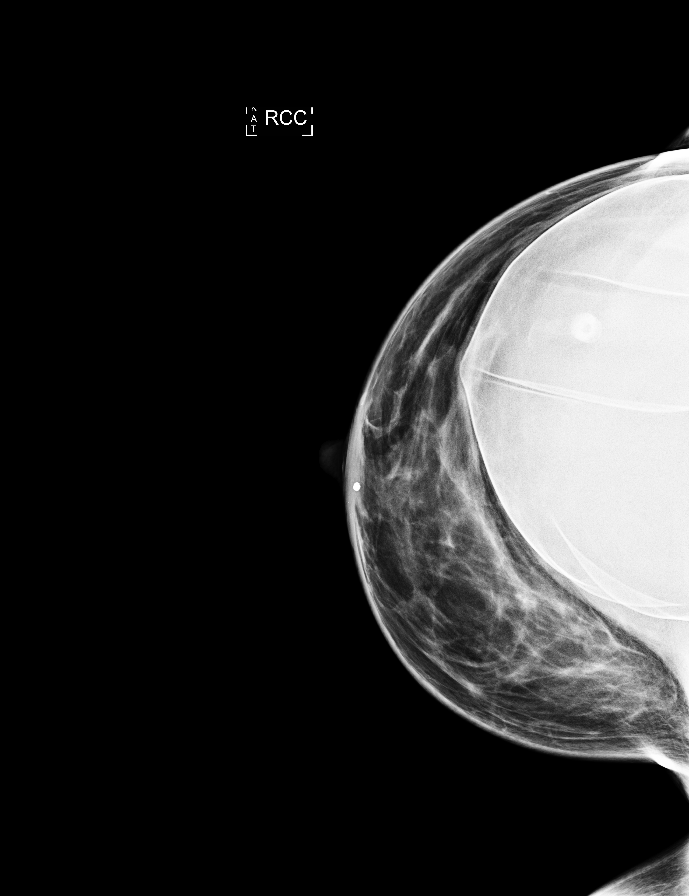

[L CC]
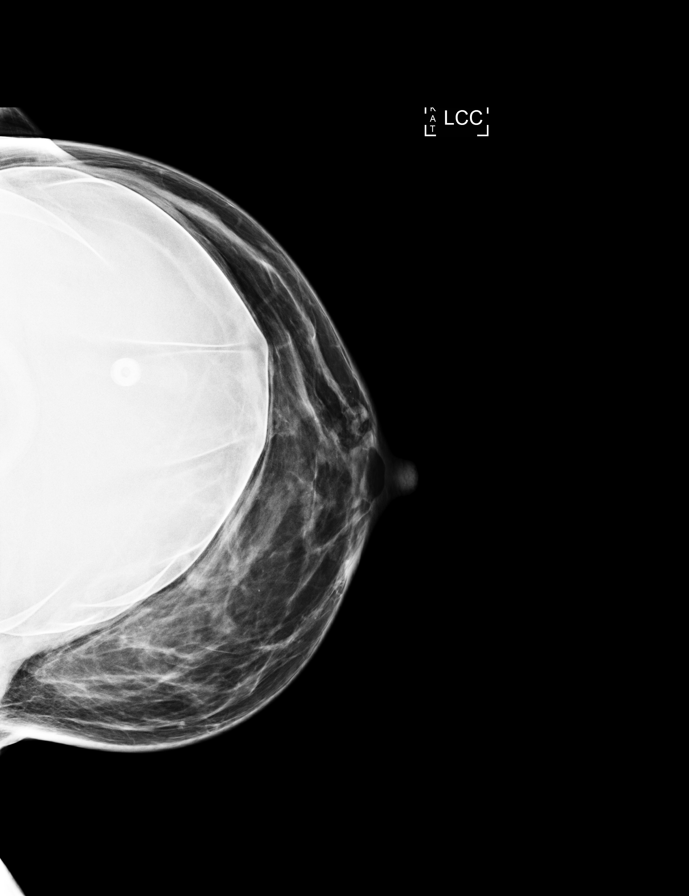

[L MLO]
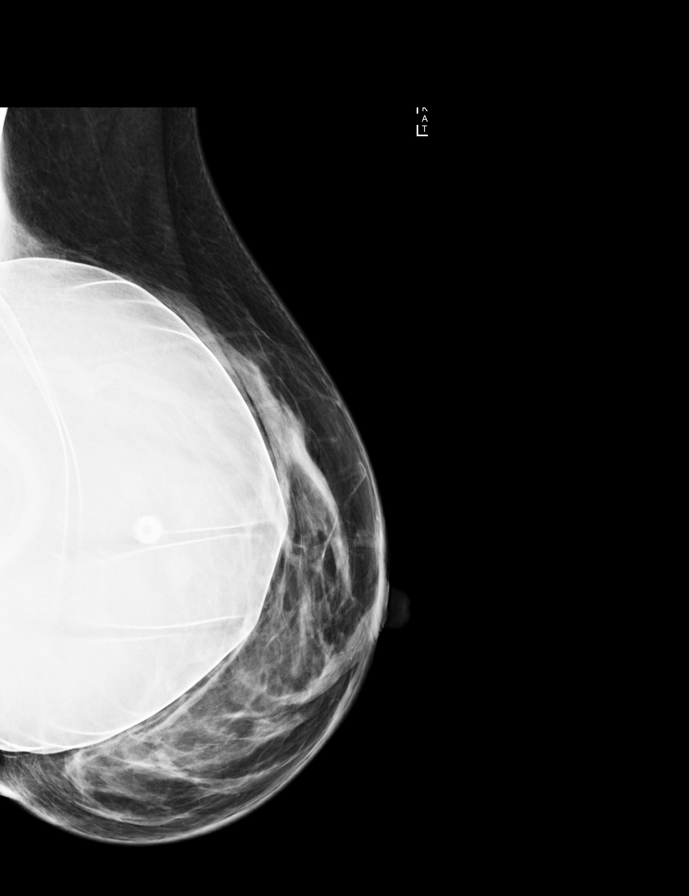

[R MLO]
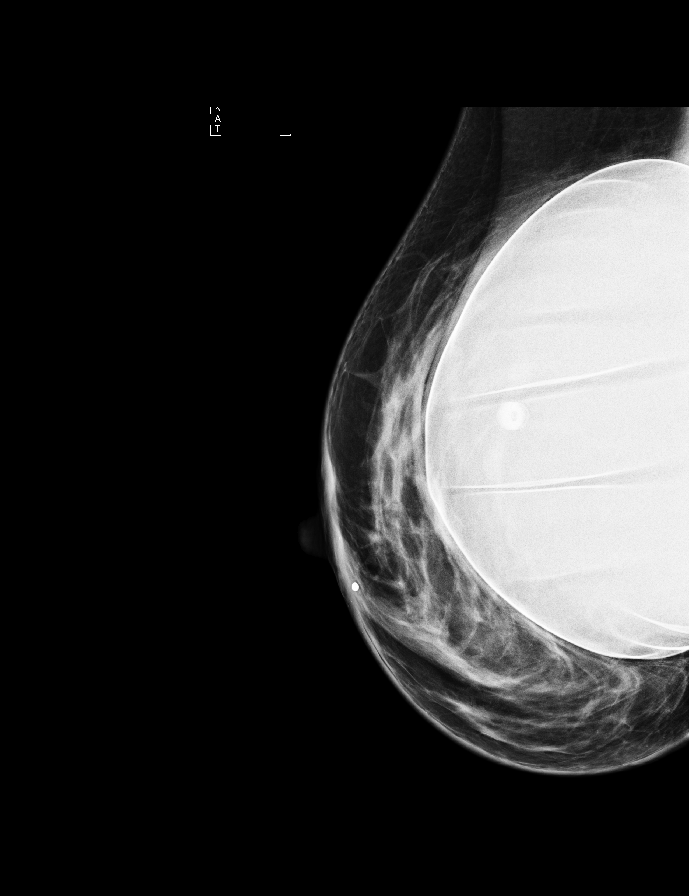

[R MLO synth-2D]
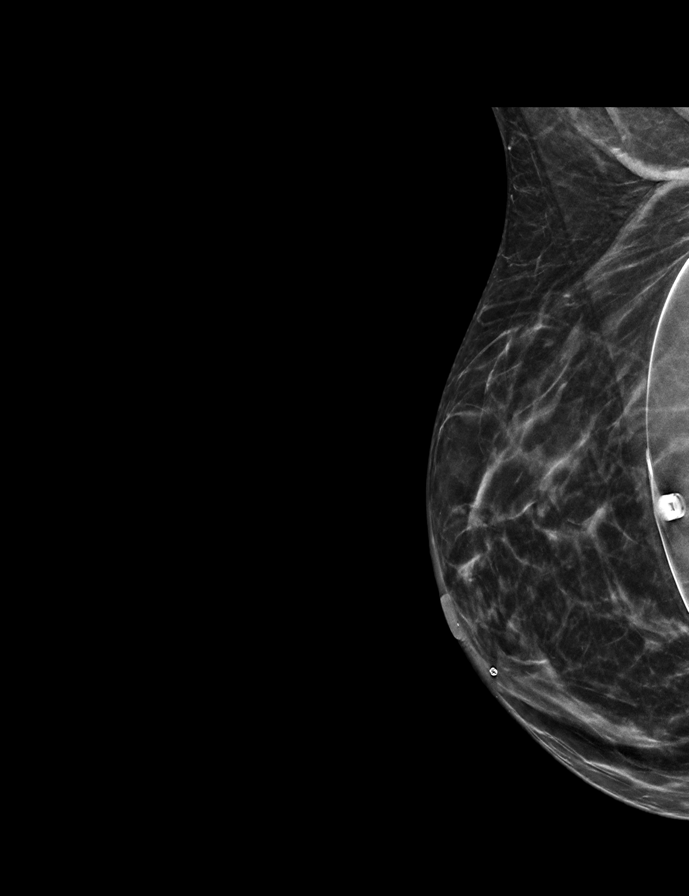

[L MLO synth-2D]
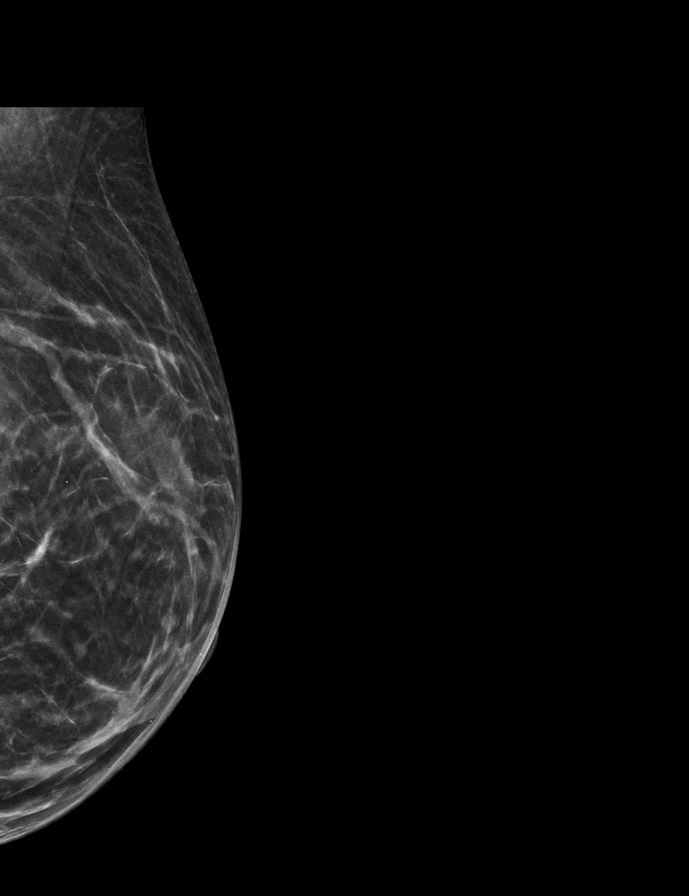

[L CC synth-2D]
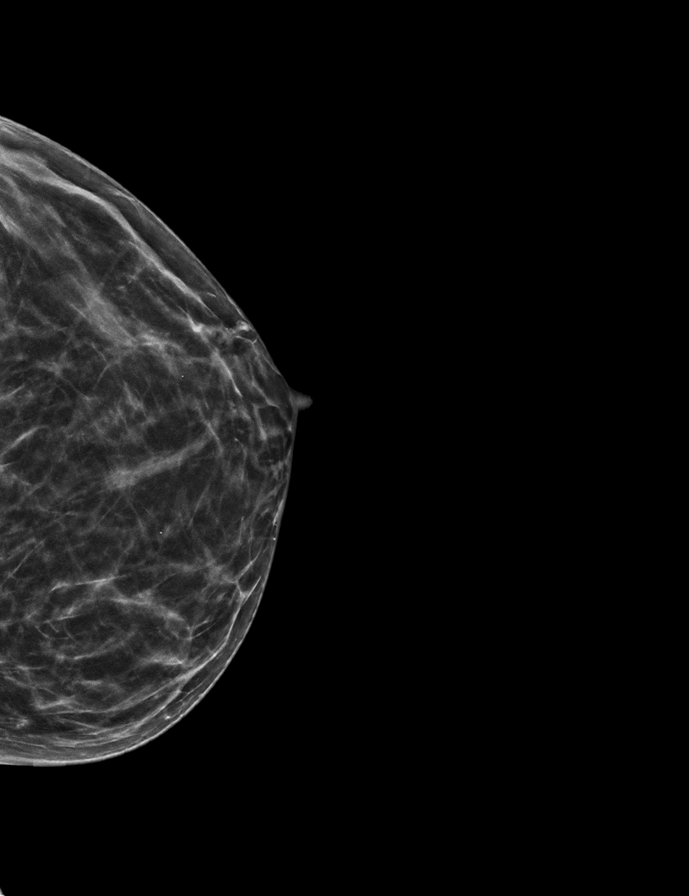

[R CC synth-2D]
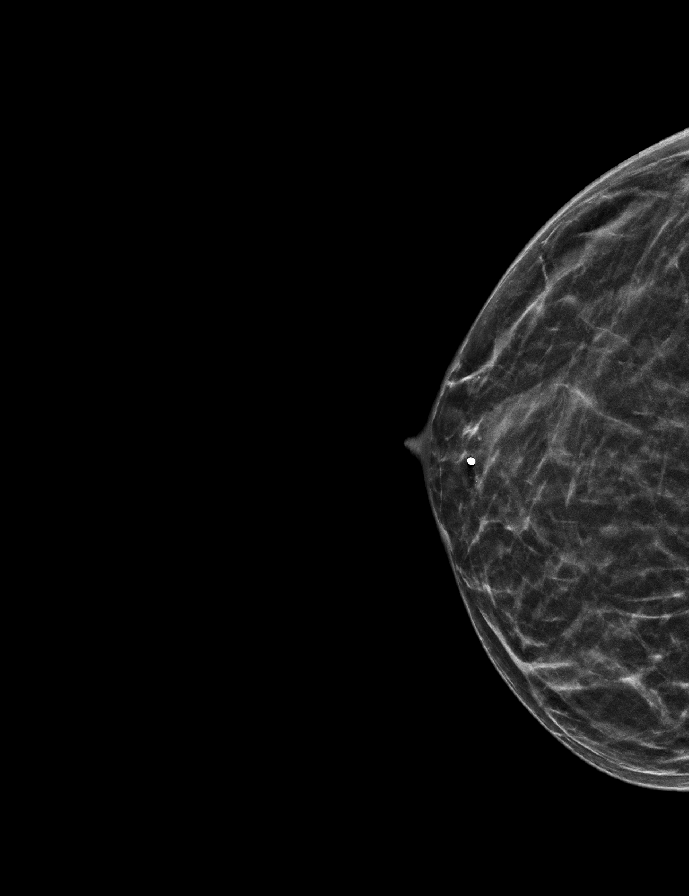

[8 of 28 positions shown; findings below may reference images not displayed]

ACR Breast Density Category b: There are scattered areas of
fibroglandular density.
FINDINGS: The previously demonstrated small, oval, circumscribed mass in the
outer left breast appears smaller and not as well visualized today.
No interval findings suspicious for malignancy in either breast.

Targeted ultrasound is performed, showing a 6 x 4 x 3 mm oval,
horizontally oriented, circumscribed mass with anechoic and medium
echotexture components in the 3 o'clock retroareolar left breast.
This measured 5 x 5 x 2 mm on 07/30/2019 and 6 x 5 x 2 mm on
01/29/2019.
IMPRESSION: 1. Stable probably benign complicated cyst or cluster of cysts in
the 3 o'clock retroareolar left breast.
2. No interval findings suspicious for malignancy in either breast.

RECOMMENDATION:
Bilateral diagnostic mammogram and left breast ultrasound in 2 years
to complete greater than 2 years of follow-up of the probably benign
left breast mass.

I have discussed the findings and recommendations with the patient.
If applicable, a reminder letter will be sent to the patient
regarding the next appointment.

BI-RADS CATEGORY  3: Probably benign.

ADDENDUM:
The 1st sentence of the recommendation should read as follows:
Bilateral diagnostic mammogram and left breast ultrasound in 1 year
to complete greater than 2 years of follow-up of the probably benign
left breast mass.

*** End of Addendum ***
ACR Breast Density Category b: There are scattered areas of
fibroglandular density.
FINDINGS: The previously demonstrated small, oval, circumscribed mass in the
outer left breast appears smaller and not as well visualized today.
No interval findings suspicious for malignancy in either breast.

Targeted ultrasound is performed, showing a 6 x 4 x 3 mm oval,
horizontally oriented, circumscribed mass with anechoic and medium
echotexture components in the 3 o'clock retroareolar left breast.
This measured 5 x 5 x 2 mm on 07/30/2019 and 6 x 5 x 2 mm on
01/29/2019.
IMPRESSION: 1. Stable probably benign complicated cyst or cluster of cysts in
the 3 o'clock retroareolar left breast.
2. No interval findings suspicious for malignancy in either breast.

RECOMMENDATION:
Bilateral diagnostic mammogram and left breast ultrasound in 2 years
to complete greater than 2 years of follow-up of the probably benign
left breast mass.

I have discussed the findings and recommendations with the patient.
If applicable, a reminder letter will be sent to the patient
regarding the next appointment.

BI-RADS CATEGORY  3: Probably benign.

## 2022-11-26 ENCOUNTER — Other Ambulatory Visit: Payer: Self-pay | Admitting: Family Medicine

## 2023-03-06 ENCOUNTER — Other Ambulatory Visit: Payer: Self-pay | Admitting: Family Medicine

## 2023-05-13 ENCOUNTER — Other Ambulatory Visit: Payer: Self-pay | Admitting: Family Medicine

## 2023-05-15 ENCOUNTER — Ambulatory Visit: Payer: BC Managed Care – PPO | Admitting: Family Medicine

## 2023-05-15 VITALS — BP 111/71 | HR 75 | Temp 98.4°F | Ht 62.0 in | Wt 134.8 lb

## 2023-05-15 DIAGNOSIS — R319 Hematuria, unspecified: Secondary | ICD-10-CM | POA: Diagnosis not present

## 2023-05-15 LAB — POCT URINALYSIS DIP (CLINITEK)
Glucose, UA: NEGATIVE mg/dL
Ketones, POC UA: NEGATIVE mg/dL
Leukocytes, UA: NEGATIVE
Nitrite, UA: NEGATIVE
Spec Grav, UA: >=1.03 — AB (ref 1.010–1.025)
Urobilinogen, UA: 0.2 U/dL
pH, UA: 6 (ref 5.0–8.0)

## 2023-05-15 NOTE — Progress Notes (Signed)
   Subjective:    Patient ID: [name], female    DOB: 1976/03/24, 47 y.o.   MRN: [id number]  Discussed the use of AI scribe software for clinical note transcription with the patient, who gave verbal consent to proceed.  History of Present Illness   The patient, with a history of urinary tract infections, presented with a new onset of urinary symptoms. She first noticed the symptoms at work, experiencing a pressure sensation and an urge to urinate. Initially, she mistook the symptoms for the onset of her menstrual period, but upon further inspection, she noticed blood in her urine, not vaginal bleeding. The patient described the urinary pressure as constant, but denied any stinging sensation during urination. She reported experiencing back pain, which she usually associates with her menstrual period, but the pain had subsided by the time of the consultation. The patient denied any personal history of kidney stones, but mentioned a family history of the same in her father. She also reported no previous instances of hematuria. The patient's menstrual cycle is regular, occurring once a month, and her last period was two weeks prior to the consultation.     She denies fever chills sweats denies dysuria    Review of Systems     Objective:    Physical Exam   CHEST: Clear to auscultation. CARDIOVASCULAR: Normal heart sounds. ABDOMEN: No upper or lower abdominal tenderness, pressure noted on palpation.      General-in no acute distress Eyes-no discharge Lungs-respiratory rate normal, CTA CV-no murmurs,RRR Extremities skin warm dry no edema Neuro grossly normal Behavior normal, alert      Assessment & Plan:  Assessment and Plan    Hematuria New onset of blood in urine with associated urinary urgency. No dysuria or flank pain. History of urinary tract infections but no personal history of kidney stones. Family history of kidney stones in father. Urinalysis confirmed presence  of blood but no white blood cells. -Order urine culture to rule out infection. -Order CT scan to evaluate for kidney stones, cysts, or other abnormalities. -Order CBC and kidney function tests. -Plan for urgent scheduling of CT scan, ideally before Christmas. -Advise patient to maintain hydration. -Plan for follow-up based on results of urine culture, blood tests, and CT scan.     1. Hematuria, unspecified type (Primary) Need to make sure that this is not coming from something worrisome CT scan can help to see if there is kidney stones or something similar going on needs further evaluation may need cystoscopy - POCT URINALYSIS DIP (CLINITEK) - Comprehensive metabolic panel - CBC with Differential - CT RENAL STONE STUDY - Urine Culture

## 2023-05-16 ENCOUNTER — Telehealth: Payer: Self-pay | Admitting: *Deleted

## 2023-05-16 LAB — COMPREHENSIVE METABOLIC PANEL
ALT: 11 [IU]/L (ref 0–32)
AST: 20 [IU]/L (ref 0–40)
Albumin: 4.2 g/dL (ref 3.9–4.9)
Alkaline Phosphatase: 51 [IU]/L (ref 44–121)
BUN/Creatinine Ratio: 22 (ref 9–23)
BUN: 14 mg/dL (ref 6–24)
Bilirubin Total: <0.2 mg/dL (ref 0.0–1.2)
CO2: 26 mmol/L (ref 20–29)
Calcium: 9.1 mg/dL (ref 8.7–10.2)
Chloride: 101 mmol/L (ref 96–106)
Creatinine, Ser: 0.64 mg/dL (ref 0.57–1.00)
Globulin, Total: 2.4 g/dL (ref 1.5–4.5)
Glucose: 87 mg/dL (ref 70–99)
Potassium: 4.4 mmol/L (ref 3.5–5.2)
Sodium: 137 mmol/L (ref 134–144)
Total Protein: 6.6 g/dL (ref 6.0–8.5)
eGFR: 109 mL/min/{1.73_m2} (ref >59)

## 2023-05-16 LAB — CBC WITH DIFFERENTIAL/PLATELET
Basophils Absolute: 0.1 10*3/uL (ref 0.0–0.2)
Basos: 1 %
EOS (ABSOLUTE): 0.1 10*3/uL (ref 0.0–0.4)
Eos: 1 %
Hematocrit: 34.3 % (ref 34.0–46.6)
Hemoglobin: 10 g/dL — ABNORMAL LOW (ref 11.1–15.9)
Immature Grans (Abs): 0 10*3/uL (ref 0.0–0.1)
Immature Granulocytes: 0 %
Lymphocytes Absolute: 2.7 10*3/uL (ref 0.7–3.1)
Lymphs: 27 %
MCH: 22 pg — ABNORMAL LOW (ref 26.6–33.0)
MCHC: 29.2 g/dL — ABNORMAL LOW (ref 31.5–35.7)
MCV: 76 fL — ABNORMAL LOW (ref 79–97)
Monocytes Absolute: 0.8 10*3/uL (ref 0.1–0.9)
Monocytes: 8 %
Neutrophils Absolute: 6.4 10*3/uL (ref 1.4–7.0)
Neutrophils: 63 %
Platelets: 406 10*3/uL (ref 150–450)
RBC: 4.54 x10E6/uL (ref 3.77–5.28)
RDW: 15.4 % (ref 11.7–15.4)
WBC: 10.2 10*3/uL (ref 3.4–10.8)

## 2023-05-16 NOTE — Telephone Encounter (Signed)
Copied from CRM [id number]. Topic: Clinical - Medical Advice >> May 16, 2023 10:06 AM Shelah Lewandowsky wrote: Reason for CRM:  Patient is feeling a little better now and just feel pressure and frequency. Wants to know if she still needs to do the renal stone study or maybe delay it.  Please patient [phone]

## 2023-05-16 NOTE — Telephone Encounter (Signed)
Thank you for the question-appreciate the update-actually is very important to do the test because not only are we making sure there is not a kidney stone or also making sure that there is not anything going on within the bladder or the kidneys that could cause blood into the urine so therefore it is important to do this test

## 2023-05-16 NOTE — Telephone Encounter (Signed)
Patient notified and verbalized understanding. 

## 2023-05-16 NOTE — Telephone Encounter (Signed)
Spoke with CT at [facility]iology and they stated it was the right test scheduled.  Message sent to referral coordinator

## 2023-05-16 NOTE — Telephone Encounter (Signed)
Copied from CRM [id number]. Topic: Clinical - Request for Lab/Test Order >> May 16, 2023  8:42 AM Dollene Primrose wrote: Reason for CRM: Shawna Orleans with [facility] [phone] [id number] Patient has CT scheduled for 12/20, was ordered under wrong code. Needs to be submitted as CT ABD and PELVIS W/O Contrast

## 2023-05-17 ENCOUNTER — Ambulatory Visit (HOSPITAL_COMMUNITY)
Admission: RE | Admit: 2023-05-17 | Discharge: 2023-05-17 | Disposition: A | Discharge status: Home / Self Care | Payer: BC Managed Care – PPO | Source: Ambulatory Visit | Attending: Family Medicine | Admitting: Family Medicine

## 2023-05-17 DIAGNOSIS — R319 Hematuria, unspecified: Secondary | ICD-10-CM | POA: Diagnosis present

## 2023-05-17 LAB — URINE CULTURE

## 2023-05-17 LAB — SPECIMEN STATUS REPORT

## 2023-05-19 ENCOUNTER — Encounter: Payer: Self-pay | Admitting: Family Medicine

## 2023-05-20 ENCOUNTER — Other Ambulatory Visit: Payer: Self-pay

## 2023-05-20 ENCOUNTER — Ambulatory Visit (HOSPITAL_COMMUNITY)
Admission: RE | Admit: 2023-05-20 | Discharge: 2023-05-20 | Disposition: A | Discharge status: Home / Self Care | Payer: BC Managed Care – PPO | Source: Ambulatory Visit | Attending: Family Medicine | Admitting: Family Medicine

## 2023-05-20 ENCOUNTER — Telehealth: Payer: Self-pay | Admitting: Family Medicine

## 2023-05-20 DIAGNOSIS — N9489 Other specified conditions associated with female genital organs and menstrual cycle: Secondary | ICD-10-CM

## 2023-05-20 DIAGNOSIS — R319 Hematuria, unspecified: Secondary | ICD-10-CM

## 2023-05-20 DIAGNOSIS — N926 Irregular menstruation, unspecified: Secondary | ICD-10-CM

## 2023-05-20 NOTE — Telephone Encounter (Signed)
Nurses Please order pelvic ultrasound complete intravaginal Due to endometrial mass versus blood products Requesting that this ultrasound be completed this week if not this week or early next week and the results listed as STAT  Also patient will be coming by to give a urine specimen today to do urine pregnancy  Also please order CBC TIBC ferritin to be completed in 3 to 4 weeks-preferably a few days before the next visit  (Patient was instructed to do ferrous sulfate 325 mg tablet 1/day for the next 4 weeks) Patient to do a follow-up visit with Dr. Adriana Simas in 4 weeks  Finally consultation with gynecology for hematuria with negative CT scan patient will need cystoscopy thank you-Dr. Lorin Picket

## 2023-05-20 NOTE — Telephone Encounter (Signed)
Urine Sample left per Dr Lorin Picket

## 2023-05-21 ENCOUNTER — Other Ambulatory Visit: Payer: Self-pay | Admitting: Family Medicine

## 2023-05-21 ENCOUNTER — Telehealth: Payer: Self-pay | Admitting: Family Medicine

## 2023-05-21 ENCOUNTER — Encounter: Payer: Self-pay | Admitting: Family Medicine

## 2023-05-21 MED ORDER — VALACYCLOVIR HCL 500 MG PO TABS: 500.0000 mg | ORAL_TABLET | Freq: Every day | ORAL | 12 refills | Status: AC

## 2023-05-21 NOTE — Telephone Encounter (Signed)
Patient completed STAT US yesterday and results are in chart

## 2023-05-21 NOTE — Telephone Encounter (Signed)
Front Please fax the letter that I dictated on 05/21/2023 along with a copy of her office visit most recent, lab work, pelvic ultrasound, CAT scan to Dr. Arelia Sneddon gynecologist with [facility] Patient states that she has appointment with them first week of January Thank you-Dr. Lorin Picket

## 2023-05-21 NOTE — Progress Notes (Signed)
This letter will go with most recent copies of her x-ray, ultrasound, lab work, office visit to Dr. Louanne Belton [facility] thank you-telephone message regarding this was sent to the front

## 2023-06-30 ENCOUNTER — Other Ambulatory Visit: Payer: Self-pay | Admitting: Family Medicine

## 2023-08-28 ENCOUNTER — Other Ambulatory Visit: Payer: Self-pay | Admitting: Family Medicine

## 2023-08-28 ENCOUNTER — Other Ambulatory Visit: Payer: Self-pay

## 2023-08-29 ENCOUNTER — Other Ambulatory Visit: Payer: Self-pay | Admitting: Nurse Practitioner

## 2023-08-29 MED ORDER — ALPRAZOLAM 0.25 MG PO TABS: ORAL_TABLET | ORAL | 0 refills | Status: DC

## 2023-10-14 ENCOUNTER — Telehealth: Payer: Self-pay | Admitting: Family Medicine

## 2023-10-14 ENCOUNTER — Other Ambulatory Visit: Payer: Self-pay | Admitting: Family Medicine

## 2023-10-14 NOTE — Telephone Encounter (Signed)
 Copied from CRM [id number]. Topic: Clinical - Medication Refill >> Oct 14, 2023  3:22 PM Stanly Early wrote: Medication: ALPRAZolam  (XANAX ) 0.25 MG tablet   Has the patient contacted their pharmacy? Yes (Agent: If no, request that the patient contact the pharmacy for the refill. If patient does not wish to contact the pharmacy document the reason why and proceed with request.) (Agent: If yes, when and what did the pharmacy advise?)  This is the patient's preferred pharmacy:  [facility] [phone] - [place], [place] - [address] AT [facility] OF [address] [place] [address] Phone: [phone] Fax: [phone]   Is this the correct pharmacy for this prescription? Yes If no, delete pharmacy and type the correct one.   Has the prescription been filled recently? No  Is the patient out of the medication? No 2 pills left  Has the patient been seen for an appointment in the last year OR does the patient have an upcoming appointment? Yes  Can we respond through MyChart? Yes  Agent: Please be advised that Rx refills may take up to 3 business days. We ask that you follow-up with your pharmacy.

## 2023-10-14 NOTE — Telephone Encounter (Unsigned)
 Copied from CRM [id number]. Topic: Clinical - Medication Refill >> Oct 14, 2023  3:22 PM Stanly Early wrote: Medication: ALPRAZolam  (XANAX ) 0.25 MG tablet   Has the patient contacted their pharmacy? Yes (Agent: If no, request that the patient contact the pharmacy for the refill. If patient does not wish to contact the pharmacy document the reason why and proceed with request.) (Agent: If yes, when and what did the pharmacy advise?)  This is the patient's preferred pharmacy:  [facility] [phone] - [place], [place] - [address] AT [facility] OF [address] [place] [address] Phone: [phone] Fax: [phone]   Is this the correct pharmacy for this prescription? Yes If no, delete pharmacy and type the correct one.   Has the prescription been filled recently? No  Is the patient out of the medication? No 2 pills left  Has the patient been seen for an appointment in the last year OR does the patient have an upcoming appointment? Yes  Can we respond through MyChart? Yes  Agent: Please be advised that Rx refills may take up to 3 business days. We ask that you follow-up with your pharmacy.

## 2023-10-17 ENCOUNTER — Other Ambulatory Visit: Payer: Self-pay | Admitting: Family Medicine

## 2023-10-17 NOTE — Telephone Encounter (Unsigned)
 Copied from CRM [id number]. Topic: Clinical - Medication Refill >> Oct 17, 2023  4:01 PM Sophia H wrote: Medication: ALPRAZolam  (XANAX ) 0.25 MG tablet **Pt sch appt for 05/28 - just needing fill to get till then, pt is completely out.   Has the patient contacted their pharmacy? Yes (Agent: If no, request that the patient contact the pharmacy for the refill. If patient does not wish to contact the pharmacy document the reason why and proceed with request.) (Agent: If yes, when and what did the pharmacy advise?)  This is the patient's preferred pharmacy:  [facility] [phone] - [place], [place] - [address] AT [facility] [address] [place] [address] Phone: [phone] Fax: [phone]    Is this the correct pharmacy for this prescription? Yes If no, delete pharmacy and type the correct one.   Has the prescription been filled recently? No  Is the patient out of the medication? Yes  Has the patient been seen for an appointment in the last year OR does the patient have an upcoming appointment? Yes  Can we respond through MyChart? Yes  Agent: Please be advised that Rx refills may take up to 3 business days. We ask that you follow-up with your pharmacy.

## 2023-10-21 MED ORDER — ALPRAZOLAM 0.25 MG PO TABS: ORAL_TABLET | ORAL | 0 refills | Status: DC

## 2023-10-23 ENCOUNTER — Encounter: Payer: Self-pay | Admitting: Family Medicine

## 2023-10-23 ENCOUNTER — Ambulatory Visit: Payer: Self-pay | Admitting: Family Medicine

## 2023-10-23 VITALS — BP 98/60 | HR 68 | Temp 97.1°F | Ht 62.0 in | Wt 139.0 lb

## 2023-10-23 DIAGNOSIS — F418 Other specified anxiety disorders: Secondary | ICD-10-CM | POA: Diagnosis not present

## 2023-10-23 DIAGNOSIS — D509 Iron deficiency anemia, unspecified: Secondary | ICD-10-CM | POA: Diagnosis not present

## 2023-10-23 MED ORDER — ALPRAZOLAM 0.25 MG PO TABS: ORAL_TABLET | ORAL | 5 refills | Status: DC

## 2023-10-23 MED ORDER — ESCITALOPRAM OXALATE 20 MG PO TABS: ORAL_TABLET | ORAL | 3 refills | Status: AC

## 2023-10-23 NOTE — Assessment & Plan Note (Signed)
 Stable.  Continue current medications.  Refilled today.

## 2023-10-23 NOTE — Progress Notes (Signed)
 Subjective:  Patient ID: [name], female    DOB: 08/16/1975  Age: 48 y.o. MRN: [id number]  CC:  Follow up   HPI:  48 year old female presents for follow-up.  Patient notes irritability but overall she is doing well.  Needs medication refills.  Anxiety controlled with alprazolam .  Appears to need refill on the Prolia as well.  Patient's previous labs revealed anemia.  Needs iron studies.  She states that she was placed on iron by her OB/GYN.  Patient Active Problem List   Diagnosis Date Noted   Microcytic anemia 10/23/2023   Depression with anxiety 02/17/2015    Social Hx   Social History   Socioeconomic History   Marital status: Married    Spouse name: Not on file   Number of children: Not on file   Years of education: Not on file   Highest education level: Not on file  Occupational History    Employer: [employer]    Comment: [place]; [employer]  Tobacco Use   Smoking status: Former    Current packs/day: 0.25    Average packs/day: 0.3 packs/day for 6.0 years (1.5 ttl pk-yrs)    Types: Cigarettes   Smokeless tobacco: Never  Vaping Use   Vaping status: Never Used  Substance and Sexual Activity   Alcohol use: No   Drug use: No   Sexual activity: Not on file  Other Topics Concern   Not on file  Social History Narrative   Married for 15 years.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   Social Drivers of Corporate investment banker Strain: Not on file  Food Insecurity: Not on file  Transportation Needs: Not on file  Physical Activity: Not on file  Stress: Not on file  Social Connections: Not on file    Review of Systems Per HPI  Objective:  BP 98/60   Pulse 68   Temp (!) 97.1 F (36.2 C)   Ht 5\' 2"  (1.575 m)   Wt 139 lb (63 kg)   SpO2 98%   BMI 25.42 kg/m      10/23/2023    2:58 PM 05/15/2023    3:45 PM 07/21/2021    9:54 AM  BP/Weight  Systolic BP 98 111 108  Diastolic BP 60 71 68  Wt. (Lbs) 139 134.8 131   BMI 25.42 kg/m2 24.66 kg/m2 23.96 kg/m2    Physical Exam Vitals and nursing note reviewed.  Constitutional:      General: She is not in acute distress.    Appearance: Normal appearance.  HENT:     Head: Normocephalic and atraumatic.  Eyes:     General:        Right eye: No discharge.        Left eye: No discharge.     Conjunctiva/sclera: Conjunctivae normal.  Cardiovascular:     Rate and Rhythm: Normal rate and regular rhythm.  Pulmonary:     Effort: Pulmonary effort is normal.     Breath sounds: Normal breath sounds. No wheezing, rhonchi or rales.  Neurological:     Mental Status: She is alert.  Psychiatric:        Mood and Affect: Mood normal.        Behavior: Behavior normal.     Lab Results  Component Value Date   WBC 10.2 05/15/2023   HGB 10.0 (L) 05/15/2023   HCT 34.3 05/15/2023   PLT 406 05/15/2023   GLUCOSE 87 05/15/2023   CHOL 161 05/17/2020  TRIG 113 05/17/2020   HDL 53 05/17/2020   LDLCALC 87 05/17/2020   ALT 11 05/15/2023   AST 20 05/15/2023   NA 137 05/15/2023   K 4.4 05/15/2023   CL 101 05/15/2023   CREATININE 0.64 05/15/2023   BUN 14 05/15/2023   CO2 26 05/15/2023   TSH 3.25 05/17/2020   HGBA1C 5.0 05/17/2020     Assessment & Plan:  Depression with anxiety Assessment & Plan: Stable.  Continue current medications.  Refilled today.   Microcytic anemia Assessment & Plan: Iron studies today.  Orders: -     Iron, TIBC and Ferritin Panel  Other orders -     ALPRAZolam ; TAKE 1 TABLET(0.25 MG) BY MOUTH DAILY AS NEEDED FOR ANXIETY  Dispense: 30 tablet; Refill: 5 -     Escitalopram  Oxalate; TAKE 1 TABLET(20 MG) BY MOUTH DAILY  Dispense: 90 tablet; Refill: 3    Follow-up:  Annually  Kathleen Papa DO [facility]

## 2023-10-23 NOTE — Assessment & Plan Note (Signed)
 Iron studies today.

## 2023-10-23 NOTE — Patient Instructions (Signed)
 Follow up annually.  Meds refilled.

## 2024-01-14 ENCOUNTER — Other Ambulatory Visit: Payer: Self-pay | Admitting: Family Medicine

## 2024-02-24 ENCOUNTER — Telehealth: Payer: Self-pay | Admitting: Family Medicine

## 2024-02-24 ENCOUNTER — Other Ambulatory Visit: Payer: Self-pay | Admitting: Family Medicine

## 2024-02-24 MED ORDER — ALPRAZOLAM 0.25 MG PO TABS: ORAL_TABLET | ORAL | 5 refills | Status: DC

## 2024-02-24 NOTE — Telephone Encounter (Signed)
 Refill on    ALPRAZolam  (XANAX ) 0.25 MG tablet    [facility]

## 2024-05-22 ENCOUNTER — Other Ambulatory Visit: Payer: Self-pay | Admitting: Family Medicine
